# Patient Record
Sex: Male | Born: 1975 | Race: Black or African American | Hispanic: No | Marital: Single | Smoking: Current every day smoker
Health system: Southern US, Community
[De-identification: ages and names within clinical notes are randomized; demographics above are authoritative.]

## PROBLEM LIST (undated history)

## (undated) DIAGNOSIS — M1990 Unspecified osteoarthritis, unspecified site: Secondary | ICD-10-CM

## (undated) DIAGNOSIS — D759 Disease of blood and blood-forming organs, unspecified: Secondary | ICD-10-CM

## (undated) DIAGNOSIS — S82122D Displaced fracture of lateral condyle of left tibia, subsequent encounter for closed fracture with routine healing: Secondary | ICD-10-CM

## (undated) DIAGNOSIS — S8290XA Unspecified fracture of unspecified lower leg, initial encounter for closed fracture: Secondary | ICD-10-CM

## (undated) DIAGNOSIS — B20 Human immunodeficiency virus [HIV] disease: Secondary | ICD-10-CM

## (undated) DIAGNOSIS — Z21 Asymptomatic human immunodeficiency virus [HIV] infection status: Secondary | ICD-10-CM

## (undated) DIAGNOSIS — I10 Essential (primary) hypertension: Secondary | ICD-10-CM

## (undated) HISTORY — DX: Essential (primary) hypertension: I10

---

## 2000-11-13 ENCOUNTER — Emergency Department (HOSPITAL_COMMUNITY): Admission: EM | Admit: 2000-11-13 | Discharge: 2000-11-13 | Payer: Self-pay | Admitting: *Deleted

## 2000-11-21 ENCOUNTER — Encounter: Payer: Self-pay | Admitting: Emergency Medicine

## 2000-11-21 ENCOUNTER — Emergency Department (HOSPITAL_COMMUNITY): Admission: EM | Admit: 2000-11-21 | Discharge: 2000-11-21 | Payer: Self-pay | Admitting: Emergency Medicine

## 2009-08-06 HISTORY — PX: LEG SURGERY: SHX1003

## 2009-09-02 DIAGNOSIS — B20 Human immunodeficiency virus [HIV] disease: Secondary | ICD-10-CM | POA: Insufficient documentation

## 2009-09-02 DIAGNOSIS — M2550 Pain in unspecified joint: Secondary | ICD-10-CM

## 2009-10-03 ENCOUNTER — Encounter: Payer: Self-pay | Admitting: Infectious Disease

## 2009-10-24 ENCOUNTER — Ambulatory Visit: Payer: Self-pay | Admitting: Internal Medicine

## 2009-10-24 ENCOUNTER — Encounter: Payer: Self-pay | Admitting: Infectious Disease

## 2009-10-24 ENCOUNTER — Encounter: Payer: Self-pay | Admitting: Infectious Diseases

## 2009-10-24 LAB — CONVERTED CEMR LAB
ALT: 12 units/L (ref 0–53)
AST: 10 units/L (ref 0–37)
Albumin: 4 g/dL (ref 3.5–5.2)
Alkaline Phosphatase: 44 units/L (ref 39–117)
BUN: 12 mg/dL (ref 6–23)
Basophils Absolute: 0 10*3/uL (ref 0.0–0.1)
Basophils Relative: 0 % (ref 0–1)
Bilirubin Urine: NEGATIVE
CO2: 25 meq/L (ref 19–32)
Calcium: 9.1 mg/dL (ref 8.4–10.5)
Casts: NONE SEEN /lpf
Chlamydia, Swab/Urine, PCR: NEGATIVE
Chloride: 103 meq/L (ref 96–112)
Cholesterol: 137 mg/dL (ref 0–200)
Creatinine, Ser: 0.89 mg/dL (ref 0.40–1.50)
Crystals: NONE SEEN
Eosinophils Absolute: 0.1 10*3/uL (ref 0.0–0.7)
Eosinophils Relative: 2 % (ref 0–5)
GC Probe Amp, Urine: NEGATIVE
Glucose, Bld: 93 mg/dL (ref 70–99)
HCT: 41 % (ref 39.0–52.0)
HCV Ab: NEGATIVE
HDL: 41 mg/dL (ref >39)
HIV 1 RNA Quant: 10500 copies/mL — ABNORMAL HIGH (ref <48)
HIV-1 RNA Quant, Log: 4.02 — ABNORMAL HIGH (ref <1.68)
HIV-1 antibody: POSITIVE — AB
HIV-2 Ab: UNDETERMINED — AB
HIV: REACTIVE
Hemoglobin, Urine: NEGATIVE
Hemoglobin: 12.9 g/dL — ABNORMAL LOW (ref 13.0–17.0)
Hep A Total Ab: NEGATIVE
Hep B Core Total Ab: NEGATIVE
Hep B S Ab: NEGATIVE
Hepatitis B Surface Ag: NEGATIVE
Ketones, ur: NEGATIVE mg/dL
LDL Cholesterol: 83 mg/dL (ref 0–99)
Leukocytes, UA: NEGATIVE
Lymphocytes Relative: 24 % (ref 12–46)
Lymphs Abs: 1.1 10*3/uL (ref 0.7–4.0)
MCHC: 31.5 g/dL (ref 30.0–36.0)
MCV: 86.9 fL (ref >=78.0)
Monocytes Absolute: 0.5 10*3/uL (ref 0.1–1.0)
Monocytes Relative: 11 % (ref 3–12)
Neutro Abs: 2.8 10*3/uL (ref 1.7–7.7)
Neutrophils Relative %: 63 % (ref 43–77)
Nitrite: NEGATIVE
Platelets: 373 10*3/uL (ref 150–400)
Potassium: 4 meq/L (ref 3.5–5.3)
Protein, ur: NEGATIVE mg/dL
RBC: 4.72 M/uL (ref 4.22–5.81)
RDW: 12.9 % (ref 11.5–15.5)
Sodium: 139 meq/L (ref 135–145)
Specific Gravity, Urine: 1.025 (ref 1.005–1.0)
Squamous Epithelial / HPF: NONE SEEN /lpf
Total Bilirubin: 0.5 mg/dL (ref 0.3–1.2)
Total CHOL/HDL Ratio: 3.3
Total Protein: 7.6 g/dL (ref 6.0–8.3)
Triglycerides: 66 mg/dL (ref <150)
Urine Glucose: NEGATIVE mg/dL
Urobilinogen, UA: 0.2 (ref 0.0–1.0)
VLDL: 13 mg/dL (ref 0–40)
WBC: 4.4 10*3/uL (ref 4.0–10.5)
pH: 5.5 (ref 5.0–8.0)

## 2009-11-16 ENCOUNTER — Ambulatory Visit: Payer: Self-pay | Admitting: Internal Medicine

## 2009-11-16 ENCOUNTER — Encounter (INDEPENDENT_AMBULATORY_CARE_PROVIDER_SITE_OTHER): Payer: Self-pay | Admitting: *Deleted

## 2009-11-16 DIAGNOSIS — I10 Essential (primary) hypertension: Secondary | ICD-10-CM

## 2009-11-16 DIAGNOSIS — M25569 Pain in unspecified knee: Secondary | ICD-10-CM

## 2009-11-24 ENCOUNTER — Telehealth: Payer: Self-pay | Admitting: Internal Medicine

## 2009-12-23 ENCOUNTER — Telehealth: Payer: Self-pay | Admitting: Internal Medicine

## 2010-01-19 ENCOUNTER — Ambulatory Visit: Payer: Self-pay | Admitting: Internal Medicine

## 2010-01-19 DIAGNOSIS — L0293 Carbuncle, unspecified: Secondary | ICD-10-CM

## 2010-01-19 DIAGNOSIS — L0292 Furuncle, unspecified: Secondary | ICD-10-CM | POA: Insufficient documentation

## 2010-01-23 ENCOUNTER — Ambulatory Visit: Payer: Self-pay | Admitting: Internal Medicine

## 2010-01-23 DIAGNOSIS — R634 Abnormal weight loss: Secondary | ICD-10-CM

## 2010-01-23 LAB — CONVERTED CEMR LAB
ALT: 31 units/L (ref 0–53)
AST: 23 units/L (ref 0–37)
Albumin: 3.7 g/dL (ref 3.5–5.2)
Alkaline Phosphatase: 60 units/L (ref 39–117)
BUN: 19 mg/dL (ref 6–23)
Basophils Absolute: 0 10*3/uL (ref 0.0–0.1)
Basophils Relative: 0 % (ref 0–1)
CO2: 23 meq/L (ref 19–32)
Calcium: 9.2 mg/dL (ref 8.4–10.5)
Chloride: 103 meq/L (ref 96–112)
Creatinine, Ser: 0.73 mg/dL (ref 0.40–1.50)
Eosinophils Absolute: 0.2 10*3/uL (ref 0.0–0.7)
Eosinophils Relative: 4 % (ref 0–5)
Glucose, Bld: 89 mg/dL (ref 70–99)
HCT: 39.7 % (ref 39.0–52.0)
HIV 1 RNA Quant: <48 copies/mL (ref <48)
HIV-1 RNA Quant, Log: <1.68 (ref <1.68)
Hemoglobin: 12.9 g/dL — ABNORMAL LOW (ref 13.0–17.0)
Lymphocytes Relative: 32 % (ref 12–46)
Lymphs Abs: 1.8 10*3/uL (ref 0.7–4.0)
MCHC: 32.5 g/dL (ref 30.0–36.0)
MCV: 86.3 fL (ref 78.0–100.0)
Monocytes Absolute: 0.5 10*3/uL (ref 0.1–1.0)
Monocytes Relative: 9 % (ref 3–12)
Neutro Abs: 3.1 10*3/uL (ref 1.7–7.7)
Neutrophils Relative %: 56 % (ref 43–77)
Platelets: 375 10*3/uL (ref 150–400)
Potassium: 4.7 meq/L (ref 3.5–5.3)
RBC: 4.6 M/uL (ref 4.22–5.81)
RDW: 15 % (ref 11.5–15.5)
Sodium: 138 meq/L (ref 135–145)
Total Bilirubin: 0.3 mg/dL (ref 0.3–1.2)
Total Protein: 7.9 g/dL (ref 6.0–8.3)
WBC: 5.5 10*3/uL (ref 4.0–10.5)

## 2010-01-31 ENCOUNTER — Telehealth: Payer: Self-pay | Admitting: Internal Medicine

## 2010-02-22 ENCOUNTER — Ambulatory Visit: Payer: Self-pay | Admitting: Internal Medicine

## 2010-02-22 DIAGNOSIS — F17200 Nicotine dependence, unspecified, uncomplicated: Secondary | ICD-10-CM

## 2010-03-01 ENCOUNTER — Ambulatory Visit: Payer: Self-pay | Admitting: Infectious Diseases

## 2010-03-21 ENCOUNTER — Encounter: Payer: Self-pay | Admitting: Internal Medicine

## 2010-04-24 ENCOUNTER — Ambulatory Visit (HOSPITAL_BASED_OUTPATIENT_CLINIC_OR_DEPARTMENT_OTHER): Admission: RE | Admit: 2010-04-24 | Discharge: 2010-04-24 | Payer: Self-pay | Admitting: General Surgery

## 2010-06-07 ENCOUNTER — Ambulatory Visit: Payer: Self-pay | Admitting: Infectious Diseases

## 2010-06-07 LAB — CONVERTED CEMR LAB
ALT: 17 units/L (ref 0–53)
AST: 18 units/L (ref 0–37)
Albumin: 4 g/dL (ref 3.5–5.2)
Alkaline Phosphatase: 67 units/L (ref 39–117)
BUN: 13 mg/dL (ref 6–23)
Basophils Absolute: 0 10*3/uL (ref 0.0–0.1)
Basophils Relative: 0 % (ref 0–1)
CO2: 25 meq/L (ref 19–32)
Calcium: 8.8 mg/dL (ref 8.4–10.5)
Chloride: 106 meq/L (ref 96–112)
Creatinine, Ser: 0.76 mg/dL (ref 0.40–1.50)
Eosinophils Absolute: 0.2 10*3/uL (ref 0.0–0.7)
Eosinophils Relative: 4 % (ref 0–5)
Glucose, Bld: 91 mg/dL (ref 70–99)
HCT: 41.1 % (ref 39.0–52.0)
HIV 1 RNA Quant: <20 copies/mL (ref <20)
HIV-1 RNA Quant, Log: <1.3 (ref <1.30)
Hemoglobin: 13.6 g/dL (ref 13.0–17.0)
Lymphocytes Relative: 29 % (ref 12–46)
Lymphs Abs: 1.2 10*3/uL (ref 0.7–4.0)
MCHC: 33.1 g/dL (ref 30.0–36.0)
MCV: 90.7 fL (ref 78.0–100.0)
Monocytes Absolute: 0.5 10*3/uL (ref 0.1–1.0)
Monocytes Relative: 13 % — ABNORMAL HIGH (ref 3–12)
Neutro Abs: 2.2 10*3/uL (ref 1.7–7.7)
Neutrophils Relative %: 54 % (ref 43–77)
Platelets: 277 10*3/uL (ref 150–400)
Potassium: 4.1 meq/L (ref 3.5–5.3)
RBC: 4.53 M/uL (ref 4.22–5.81)
RDW: 13.9 % (ref 11.5–15.5)
Sodium: 140 meq/L (ref 135–145)
Total Bilirubin: 0.4 mg/dL (ref 0.3–1.2)
Total Protein: 7.2 g/dL (ref 6.0–8.3)
WBC: 4.1 10*3/uL (ref 4.0–10.5)

## 2010-09-05 NOTE — Progress Notes (Signed)
Summary: Lab results  Phone Note Call from Patient   Caller: [phone] ext [phone]  Summary of Call: Pt is calling for test results that would check drug resistance. Tomasita Morrow RN  November 24, 2009 2:34 PM   Follow-up for Phone Call        please apologize to him for not calling He is not resistant to anything so we can start Atripla we discussed potential side efffects at alst visit He should call if he has any problems. If no problems return in 6 weeks for labs and f/u with me 2 weeks later med list updated Follow-up by: Yisroel Ramming MD,  November 24, 2009 3:11 PM    New/Updated Medications: ATRIPLA 600-200-300 MG TABS (EFAVIRENZ-EMTRICITAB-TENOFOVIR) Take 1 tablet by mouth at bedtime Prescriptions: ATRIPLA 600-200-300 MG TABS (EFAVIRENZ-EMTRICITAB-TENOFOVIR) Take 1 tablet by mouth at bedtime  #30 x 3   Entered by:   Tomasita Morrow RN   Authorized by:   Yisroel Ramming MD   Signed by:   Tomasita Morrow RN on 11/24/2009   Method used:   [facility]        [facility] [address] [phone]* (retail)       [address]       [place], [place]  [address]       Ph: [phone] or [phone]       Fax: [phone]   RxID:   [id number]
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
## 2010-09-05 NOTE — Progress Notes (Signed)
Summary: Request for Ibuprofen  Phone Note Call from Patient Call back at Home Phone [phone]   Caller: Patient Reason for Call: Refill Medication Summary of Call: Patient called requesting a new prescription be sent to a different pharmacy (preferably [facility]) for Ibuprofen.  Patient would like a call back at [phone] or work [phone] Xt [phone] once it has been sent. Initial call taken by: Paulo Fruit  BS,CPht II,MPH,  Dec 23, 2009 11:50 AM  Follow-up for Phone Call        ok # 90 with 5 refills Follow-up by: Yisroel Ramming MD,  Dec 23, 2009 3:25 PM    Prescriptions: IBUPROFEN 200 MG TABS (IBUPROFEN) take two every 8 hours for body aches  #90 x 5   Entered by:   Paulo Fruit  BS,CPht II,MPH   Authorized by:   Yisroel Ramming MD   Signed by:   Paulo Fruit  BS,CPht II,MPH on 12/26/2009   Method used:   Electronically to        [facility] [address] [phone]* (retail)       [address]       [place], [place]  [address]       Ph: [phone] or [phone]       Fax: [phone]   RxID:   [id number]  Paulo Fruit  BS,CPht II,MPH  Dec 26, 2009 3:11 PM

## 2010-09-05 NOTE — Miscellaneous (Signed)
Summary: HIPAA Restrictions  HIPAA Restrictions   Imported By: Florinda Marker 01/23/2010 15:21:03  _____________________________________________________________________  External Attachment:    Type:   Image     Comment:   External Document

## 2010-09-05 NOTE — Assessment & Plan Note (Signed)
Summary: new 042 pt/intake 3/21/kam   CC:  new patient to establish, B/P elevated, and knee and wrist pain .  History of Present Illness: This is the first ID clinic visit for Mr. Gregory Whitney. He was diagnosed HIV positive 1/11.  He had never been tested prior to this. He c/o several months of progressive diffuse joint pain worse in right knee. In fact he is unable to fully flex his right knee.  No known injury or recent change in activity. He has also been told in the past that he had HTN but he does not have a PCP that he has been seeing. HTN runs in his family. He denies night sweats, recent weight loss or diarrhea. His risk factor is MSM. He currently does not have a partner.  Preventive Screening-Counseling & Management  Alcohol-Tobacco     Alcohol drinks/day: 1/2 to 1 pint per day     Alcohol type: spirits     Smoking Status: current     Packs/Day: 1     Year Started: 15 years ago  Caffeine-Diet-Exercise     Caffeine use/day: 10 sodas per day     Does Patient Exercise: no  Safety-Violence-Falls     Seat Belt Use: yes      Sexual History:  n/a.        Drug Use:  Yes.     Current Allergies (reviewed today): No known allergies  Social History: Sexual History:  n/a  Review of Systems  The patient denies anorexia, fever, weight loss, chest pain, and headaches.    Vital Signs:  Patient profile:   35 year old male Height:      74 inches (187.96 cm) Weight:      198.8 pounds (90.36 kg) BMI:     25.62 Temp:     98.6 degrees F (37.00 degrees C) oral Pulse rate:   98 / minute BP sitting:   156 / 110  (right arm)  Vitals Entered By: Wendall Mola CMA Duncan Dull) (November 16, 2009 11:10 AM) CC: new patient to establish, B/P elevated, knee and wrist pain  Is Patient Diabetic? No Pain Assessment Patient in pain? no      Nutritional Status BMI of 25 - 29 = overweight Nutritional Status Detail appetite "good"  Does patient need assistance? Functional Status Self  care Ambulation Normal Comments pt states B/P has been elevated for awhile   Physical Exam  General:  alert, well-developed, well-nourished, and well-hydrated.   Head:  normocephalic and atraumatic.   Mouth:  pharynx pink and moist.  no thrush  Neck:  no cervical lymphadenopathy.   Lungs:  normal breath sounds.   Heart:  normal rate, regular rhythm, and no murmur.   Msk:  right knee with an effusion ability to fully extend but not flex no warmth or readness   Impression & Recommendations:  Problem # 1:  HIV INFECTION (ICD-042) Discussed pathophysiology of HIV and the meaning of CD4ct and VL.  Pt.s current Cd4ct is 90  and VL is 10,500 and genotype is pending .  At this Point antiretroviral treatment is indicated .  Discussed treatment options. We will await to start treatment pending the genotype results. If he has a niave virus he would like to start Atripla.  I will call him once his genotype returns.  PCP prophylaxis was started with Bactrim.Discussed safe sex and transmisiion routes with the patient.  Hep B vaccine #1 was given.  Orders: New Patient Level III [phone])  Diagnostics  Reviewed:  HIV: REACTIVE (10/24/2009)   HIV-Western blot: Positive (10/24/2009)   CD4: 90 (10/25/2009)   WBC: 4.4 (10/24/2009)   Hgb: 12.9 (10/24/2009)   HCT: 41.0 (10/24/2009)   Platelets: 373 (10/24/2009) HIV genotype: * (10/24/2009)   HIV-1 RNA: 10500 (10/24/2009)   HBSAg: NEG (10/24/2009)  Problem # 2:  ESSENTIAL HYPERTENSION, BENIGN (ICD-401.1) Will start Atenolol. His updated medication list for this problem includes:    Atenolol 50 Mg Tabs (Atenolol) .Marland Kitchen... Take 1 tablet by mouth once a day  Problem # 3:  KNEE PAIN, RIGHT (ICD-719.46) refer to orthopedics. His updated medication list for this problem includes:    Ibuprofen 200 Mg Tabs (Ibuprofen) .Marland Kitchen... Take two every 8 hours for body aches  Orders: Orthopedic Referral (Ortho)  Medications Added to Medication List This Visit: 1)   Atenolol 50 Mg Tabs (Atenolol) .... Take 1 tablet by mouth once a day 2)  Bactrim Ds 800-160 Mg Tabs (Sulfamethoxazole-trimethoprim) .... Take 1 tablet by mouth once a day  Other Orders: Hepatitis B Vaccine >39yrs (29528) Admin 1st Vaccine (41324) Prescriptions: ATENOLOL 50 MG TABS (ATENOLOL) Take 1 tablet by mouth once a day  #30 x 5   Entered and Authorized by:   Yisroel Ramming MD   Signed by:   Yisroel Ramming MD on 11/16/2009   Method used:   Print then Give to Patient   RxID:   [id number] BACTRIM DS 800-160 MG TABS (SULFAMETHOXAZOLE-TRIMETHOPRIM) Take 1 tablet by mouth once a day  #30 x 5   Entered and Authorized by:   Yisroel Ramming MD   Signed by:   Yisroel Ramming MD on 11/16/2009   Method used:   Print then Give to Patient   RxID:   [id number]    Immunizations Administered:  Hepatitis B Vaccine # 1:    Vaccine Type: HepB Adult    Site: left deltoid    Mfr: Merck    Dose: 0.5 ml    Route: IM    Given by: Wendall Mola CMA ( AAMA)    Exp. Date: 05/27/2012    Lot #: [id number]    VIS given: 02/20/06 version given November 16, 2009.

## 2010-09-05 NOTE — Letter (Signed)
Summary: [facility]   Imported By: Florinda Marker 04/20/2010 14:28:21  _____________________________________________________________________  External Attachment:    Type:   Image     Comment:   External Document

## 2010-09-05 NOTE — Assessment & Plan Note (Signed)
Summary: new 042 intake/kam      Infectious Disease New Patient Intake Referring MD/Agency: Dr Ellamae Sia Address: [address] [place], [place]  [address]  Return Appointment Date: 11/16/2009    Do you have a Primary physician: No Are family members aware of patient's diagnosis?  If so, are they supportive? None  Medical History Medication Allergies: No   Medications: Yes   Current Meds:  IBUPROFEN 200 MG TABS (IBUPROFEN) take two every 8 hours for body aches   Family History Hypertension: Yes  Family Side: Paternal  Comments: 33  Tobacco use: current Year started: 15 years ago Amt: 1 packs per day.  Behavioral Health Assessment Have you ever been diagnosed with depression or mental illness? No  Do you drink alcohol? No Do you use recreational drugs? Yes Drugs: marijuana- 2 joints daily  Do you feel you have a problem with drugs and/or alcohol? No   Have you ever been in a treatment facility for any addiction? No If you are currently using drugs and/or alcohol, would you like help for this addiction? No  HIV Intake Information When did you first test positive for HIV? 09/02/2009 Type of test Conducted: WB   Where was this test performed?  Name of Agency: [facility]  City/State: [place], [place]   [place]: [place] Was this your first time ever being tested or HIV? 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   Have you ever been hospitalized for any HIV-related condition? No  Have you ever been under the care of a physician for being HIV positive? No  Newly Diagnosed Patients Has a Disease Intervention Specialist from the Health Department contacted the patient? Yes.   The patient has been informed that the [facility] Department will contact ALL newly reported cases. Health Department Contact:  [phone]   (SSN is needed for confirmation)  Reported Today:  10/13/2009 Health Department Contact:  [phone]            (SSN is needed for confirmation)  Person Reporting: State [facility]  HIV Medications Information The patient is currently NOT taking any HIV medications.  Infection History  Patient has been diagnosed with the following opportunistic infections: Are there any other symptoms you need to discuss? No Have you received literature/education prior to this visit about HIV/AIDS? No Do you understand the meaning of a Viral Load? No Do you understand the meaning of a CD4 count? No Lab Values Education/Handout Given Yes Medication Education/Handout Given Yes  Sexual History Are you in a current relationship? No Are you currently sexually active? No Safe Sex Counseling/Pamphlet Given Sexual History Comments: One sexual partner within the last year.   Evaluation and Follow-Up INTAKE CHECK LIST: HIV Education, Safe Sex Counseling, Case Management Referral, HIV Material Given, [employer] Consent, Sliding Fee Scale  Prevention For Positives: 10/27/2009   Safe sex practices discussed with patient. Condoms offered. Juanell Fairly Consent: Yes Are you in need of condoms at this time? No Our patient has been informed that condoms are always available in this clinic.   Brochure Provided for Above Organizations? No Does patient have problems that warrant Social Worker referral? No             Prevention For Positives: 10/27/2009   Safe sex practices discussed with patient. Condoms offered.        10/27/2009   Patient was screened for substance abuse and depression. Referal  was made as indicated.                      Immunizations Administered:  Influenza Vaccine # 1:    Vaccine Type: Fluvax Non-MCR    Site: right deltoid    Mfr: NOVARTIS    Dose: 0.5 ml    Route: IM    Given by: Tomasita Morrow RN    Exp. Date: 12/03/2009    Lot #: [id number]    VIS given: 02/27/07 version given October 24, 2009.  PPD Skin Test:    Vaccine Type: PPD     Site: left forearm    Mfr: Sanofi Pasteur    Dose: 0.05 ml    Route: ID    Given by: Tomasita Morrow RN    Exp. Date: 01/01/2012    Lot #: [id number]  Pneumonia Vaccine:    Vaccine Type: Pneumovax    Site: right deltoid    Mfr: Merck    Dose: 0.5 ml    Route: IM    Given by: Tomasita Morrow RN    Exp. Date: 03/20/2011    Lot #: [id number]    VIS given: 03/03/96 version given October 24, 2009.  Flu Vaccine Consent Questions:    Do you have a history of severe allergic reactions to this vaccine? no    Any prior history of allergic reactions to egg and/or gelatin? no    Do you have a sensitivity to the preservative Thimersol? no    Do you have a past history of Guillan-Barre Syndrome? no    Do you currently have an acute febrile illness? no    Have you ever had a severe reaction to latex? no    Vaccine information given and explained to patient? yes

## 2010-09-05 NOTE — Assessment & Plan Note (Signed)
Summary: NEW O42[MKJ]   CC:  follow-up visit.  History of Present Illness: Pt here to get labs.  The boil has improved and continues to drain.  His appetite is improving but he has lost a lot of weight. No night sweats or fevers.  No cough. No swollen nodes.  Preventive Screening-Counseling & Management  Alcohol-Tobacco     Alcohol drinks/day: 0     Smoking Status: current     Packs/Day: 1     Year Started: 15 years ago  Caffeine-Diet-Exercise     Caffeine use/day: 3 sodas per week     Does Patient Exercise: no  Safety-Violence-Falls     Seat Belt Use: yes      Sexual History:  n/a.        Drug Use:  Yes.    Comments: pt. declined condoms   Updated Prior Medication List: IBUPROFEN 200 MG TABS (IBUPROFEN) take two every 8 hours for body aches ATENOLOL 50 MG TABS (ATENOLOL) Take 1 tablet by mouth once a day BACTRIM DS 800-160 MG TABS (SULFAMETHOXAZOLE-TRIMETHOPRIM) Take 1 tablet by mouth once a day ATRIPLA 600-200-300 MG TABS (EFAVIRENZ-EMTRICITAB-TENOFOVIR) Take 1 tablet by mouth at bedtime DOXYCYCLINE HYCLATE 100 MG CAPS (DOXYCYCLINE HYCLATE) Take 1 capsule by mouth two times a day ULTRAM 50 MG TABS (TRAMADOL HCL) Take 1 tablet by mouth every 8 hours as needed  Current Allergies (reviewed today): No known allergies  Review of Systems  The patient denies anorexia, fever, weight loss, abdominal pain, and severe indigestion/heartburn.    Vital Signs:  Patient profile:   35 year old male Height:      74 inches (187.96 cm) Weight:      173.0 pounds (78.64 kg) BMI:     22.29 Temp:     98.4 degrees F (36.89 degrees C) oral Pulse rate:   86 / minute BP sitting:   128 / 80  Vitals Entered By: Wendall Mola CMA Duncan Dull) (January 23, 2010 2:08 PM) CC: follow-up visit Is Patient Diabetic? No Pain Assessment Patient in pain? no      Nutritional Status BMI of 19 -24 = normal Nutritional Status Detail appetite "better"  Have you ever been in a relationship where  you felt threatened, hurt or afraid?No   Does patient need assistance? Functional Status Self care Ambulation Normal Comments no missed doses of meds per patient   Physical Exam  General:  alert, well-hydrated, and underweight appearing.   Head:  normocephalic and atraumatic.   Mouth:  pharynx pink and moist.   Lungs:  normal breath sounds.     Impression & Recommendations:  Problem # 1:  HIV INFECTION (ICD-042) Will obtain labs today and have pt f/u in 2 weeks. Orders: Est. Patient Level III (04540)  Diagnostics Reviewed:  HIV: REACTIVE (10/24/2009)   HIV-Western blot: Positive (10/24/2009)   CD4: 90 (10/25/2009)   WBC: 4.4 (10/24/2009)   Hgb: 12.9 (10/24/2009)   HCT: 41.0 (10/24/2009)   Platelets: 373 (10/24/2009) HIV genotype: * (10/24/2009)   HIV-1 RNA: 10500 (10/24/2009)   HBSAg: NEG (10/24/2009)  Problem # 2:  LOSS OF WEIGHT (ICD-783.21) pt on protein shakes if continues will need w/u  Patient Instructions: 1)  Please schedule a follow-up appointment in 2 weeks.
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
## 2010-09-05 NOTE — Consult Note (Signed)
Summary: New Pt. Referral: Dr. Merla Riches  New Pt. Referral: Dr. Merla Riches   Imported By: Florinda Marker 11/17/2009 16:21:15  _____________________________________________________________________  External Attachment:    Type:   Image     Comment:   External Document

## 2010-09-05 NOTE — Miscellaneous (Signed)
Summary: HIPAA Restrictions  HIPAA Restrictions   Imported By: Florinda Marker 10/24/2009 14:24:24  _____________________________________________________________________  External Attachment:    Type:   Image     Comment:   External Document

## 2010-09-05 NOTE — Miscellaneous (Signed)
Summary: clinical update/ryan white  Clinical Lists Changes  Observations: Added new observation of PAYOR: Private (11/16/2009 12:09) Added new observation of AIDSDAP: No (11/16/2009 12:09) Added new observation of PCTFPL: 171.13  (11/16/2009 12:09) Added new observation of INCOMESOURCE: WAGES  (11/16/2009 12:09) Added new observation of HOUSEINCOME: 44010  (11/16/2009 12:09) Added new observation of #CHILD<18 IN: No  (11/16/2009 12:09) Added new observation of FAMILYSIZE: 1  (11/16/2009 12:09) Added new observation of HOUSING: Stable/permanent  (11/16/2009 12:09) Added new observation of FINASSESSDT: 11/16/2009  (11/16/2009 12:09) Added new observation of YEARLYEXPEN: 2100  (11/16/2009 12:09) Added new observation of RW VITAL STA: Active  (11/16/2009 12:09)

## 2010-09-05 NOTE — Progress Notes (Signed)
Summary: Patient requesting prescription  Phone Note Outgoing Call   Caller: Patient Reason for Call: Refill Medication Summary of Call: Patient left a message stating that he has switched pharmacies.  He was going to [facility] but [facility]  He [facility] someone to [facility]  I will call patient at the number he left [phone] or his work number that he also left [phone] to [facility] out [facility]. Initial call taken by: Paulo Fruit  BS,CPht II,MPH,  January 31, 2010 9:57 AM Call placed by: Paulo Fruit  BS,CPht II,MPH,  January 31, 2010 9:57 AM Call placed to: Patient Reason for Call: Get patient information Summary of Call: Left message for patient to call Byrd Hesselbach at [phone].  This message was left on his cell phone. Initial call taken by: Paulo Fruit  BS,CPht II,MPH,  January 31, 2010 9:59 AM  Follow-up for Phone Call        Patient called back and would like for the Mel[facility].  Patient states that he is having body aches and this seems to work for him.  Meloxicam 15mg  once daily Follow-up by: Paulo Fruit  BS,CPht II,MPH,  January 31, 2010 4:21 PM  Additional Follow-up for Phone Call Additional follow up Details #1::        ok #30 with 5 refils Additional Follow-up by: Yisroel Ramming MD,  February 01, 2010 9:35 AM    New/Updated Medications: MELOXICAM 15 MG TABS (MELOXICAM) Take 1 tablet by mouth once a day Prescriptions: MELOXICAM 15 MG TABS (MELOXICAM) Take 1 tablet by mouth once a day  #30 x 5   Entered by:   Paulo Fruit  BS,CPht II,MPH   Authorized by:   Yisroel Ramming MD   Signed by:   Yisroel Ramming MD on 02/02/2010   Method used:   [facility]        [facility] [address] [phone]* (retail)       [address]       [place], [place]  [address]       Ph: [phone] or [phone]       Fax: [phone]   RxID:   [id number]

## 2010-09-05 NOTE — Miscellaneous (Signed)
Summary: Problems, Allergies and Labs  Clinical Lists Changes  Problems: Added new problem of HIV INFECTION (ICD-042) Added new problem of ARTHRALGIA (ICD-719.40) Medications: Added new medication of MOBIC 15 MG TABS (MELOXICAM) Take 1 tablet by mouth once a day per PCP Orders: Added new Test order of T-CBC w/Diff [phone]) - Signed Added new Test order of T-CD4SP [facility]) (CD4SP) - Signed Added new Test order of T-Chlamydia  Probe, urine [phone]) - Signed Added new Test order of T-GC Probe, urine [id number]) - Signed Added new Test order of T-Comprehensive Metabolic Panel [phone]) - Signed Added new Test order of T-RPR (Syphilis) [phone]) - Signed Added new Test order of T-Hepatitis B Surface Antigen [phone]) - Signed Added new Test order of T-Hepatitis B Surface Antibody [phone]) - Signed Added new Test order of T-Hepatitis B Core Antibody ([id number]) - Signed Added new Test order of T-Hepatitis C Antibody ([id number]) - Signed Added new Test order of T-Hepatitis A Antibody ([id number]) - Signed Added new Test order of T-HIV Antibody  (Reflex) ([id number]) - Signed Added new Test order of T-HIV Viral Load (85462-70350) - Signed Added new Test order of T-HIV Ab Confirmatory Test/Western Blot ([id number]) - Signed Added new Test order of T-Urinalysis ([id number]) - Signed Observations: Added new observation of NKA: T (10/03/2009 12:21)     Process Orders Check Orders Results:     [facility]: ABN not required for this insurance Order queued for requisitioning for [facility]: October 03, 2009 12:33 PM  Tests Sent for requisitioning (October 03, 2009 12:33 PM):     10/03/2009: [facility] -- T-CBC w/Diff [[id number]] (signed)     10/03/2009: [facility] -- [employer], urine [phone] (signed)     10/03/2009: [facility] -- T-GC Probe, urine [phone] (signed)    10/03/2009: [facility] -- T-Comprehensive Metabolic Panel [[id number]] (signed)     10/03/2009: [facility] -- T-RPR (Syphilis) [phone] (signed)     10/03/2009: [facility] -- T-Hepatitis B Surface Antigen [[id number]] (signed)     10/03/2009: [facility] -- T-Hepatitis B Surface Antibody [[id number]] (signed)     10/03/2009: [facility] -- T-Hepatitis B Core Antibody [[id number]] (signed)     10/03/2009: [facility] -- T-Hepatitis C Antibody [[id number]] (signed)     10/03/2009: [facility] -- T-Hepatitis A Antibody [[id number]] (signed)     10/03/2009: [facility] -- T-HIV Antibody  (Reflex) [[id number]] (signed)     10/03/2009: [facility] -- T-HIV Viral Load [phone] (signed)     10/03/2009: [facility] -- T-HIV Ab Confirmatory Test/Western Blot [phone] (signed)     10/03/2009: [facility] -- T-Urinalysis [[id number]] (signed)
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
## 2010-10-17 LAB — T-HELPER CELL (CD4) - (RCID CLINIC ONLY)
CD4 % Helper T Cell: 16 % — ABNORMAL LOW (ref 33–55)
CD4 T Cell Abs: 190 uL — ABNORMAL LOW (ref 400–2700)

## 2010-10-19 LAB — HEPATIC FUNCTION PANEL
AST: 24 U/L (ref 0–37)
Albumin: 3.6 g/dL (ref 3.5–5.2)
Alkaline Phosphatase: 61 U/L (ref 39–117)
Bilirubin, Direct: 0.3 mg/dL (ref 0.0–0.3)
Total Bilirubin: 0.4 mg/dL (ref 0.3–1.2)

## 2010-10-19 LAB — BASIC METABOLIC PANEL
BUN: 10 mg/dL (ref 6–23)
CO2: 24 mEq/L (ref 19–32)
Calcium: 9 mg/dL (ref 8.4–10.5)
Chloride: 102 mEq/L (ref 96–112)
Creatinine, Ser: 0.7 mg/dL (ref 0.4–1.5)
GFR calc Af Amer: >60 mL/min (ref >60)
GFR calc non Af Amer: >60 mL/min (ref >60)
Glucose, Bld: 96 mg/dL (ref 70–99)
Potassium: 3.9 mEq/L (ref 3.5–5.1)
Sodium: 136 mEq/L (ref 135–145)

## 2010-10-19 LAB — DIFFERENTIAL
Basophils Relative: 0 % (ref 0–1)
Eosinophils Absolute: 0.1 10*3/uL (ref 0.0–0.7)
Lymphs Abs: 1.9 10*3/uL (ref 0.7–4.0)
Monocytes Absolute: 0.5 10*3/uL (ref 0.1–1.0)
Monocytes Relative: 10 % (ref 3–12)
Neutro Abs: 2.5 10*3/uL (ref 1.7–7.7)
Neutrophils Relative %: 51 % (ref 43–77)

## 2010-10-19 LAB — ANAEROBIC CULTURE

## 2010-10-19 LAB — CBC
HCT: 39.1 % (ref 39.0–52.0)
Hemoglobin: 13.4 g/dL (ref 13.0–17.0)
MCH: 30.7 pg (ref 26.0–34.0)
MCHC: 34.3 g/dL (ref 30.0–36.0)
RBC: 4.37 MIL/uL (ref 4.22–5.81)

## 2010-10-19 LAB — AFB CULTURE WITH SMEAR (NOT AT ARMC)

## 2010-10-22 LAB — T-HELPER CELL (CD4) - (RCID CLINIC ONLY)
CD4 % Helper T Cell: 13 % — ABNORMAL LOW (ref 33–55)
CD4 T Cell Abs: 230 uL — ABNORMAL LOW (ref 400–2700)

## 2010-10-23 ENCOUNTER — Other Ambulatory Visit: Payer: Self-pay

## 2010-10-30 LAB — T-HELPER CELL (CD4) - (RCID CLINIC ONLY)
CD4 % Helper T Cell: 9 % — ABNORMAL LOW (ref 33–55)
CD4 T Cell Abs: 90 uL — ABNORMAL LOW (ref 400–2700)

## 2010-10-31 ENCOUNTER — Other Ambulatory Visit: Payer: 59

## 2010-10-31 ENCOUNTER — Other Ambulatory Visit: Payer: Self-pay | Admitting: Licensed Clinical Social Worker

## 2010-10-31 DIAGNOSIS — B20 Human immunodeficiency virus [HIV] disease: Secondary | ICD-10-CM

## 2010-10-31 LAB — CBC WITH DIFFERENTIAL/PLATELET
Basophils Relative: 0 % (ref 0–1)
Eosinophils Absolute: 0.1 10*3/uL (ref 0.0–0.7)
Eosinophils Relative: 3 % (ref 0–5)
Lymphs Abs: 1.2 10*3/uL (ref 0.7–4.0)
MCH: 30 pg (ref 26.0–34.0)
MCHC: 33 g/dL (ref 30.0–36.0)
MCV: 90.7 fL (ref 78.0–100.0)
Platelets: 306 10*3/uL (ref 150–400)
RBC: 4.54 MIL/uL (ref 4.22–5.81)
RDW: 13.1 % (ref 11.5–15.5)

## 2010-10-31 NOTE — Progress Notes (Signed)
Opened in error

## 2010-11-01 LAB — COMPREHENSIVE METABOLIC PANEL
ALT: 16 U/L (ref 0–53)
AST: 14 U/L (ref 0–37)
Albumin: 4 g/dL (ref 3.5–5.2)
Alkaline Phosphatase: 69 U/L (ref 39–117)
Potassium: 4.3 mEq/L (ref 3.5–5.3)
Sodium: 140 mEq/L (ref 135–145)
Total Bilirubin: 0.3 mg/dL (ref 0.3–1.2)
Total Protein: 7.1 g/dL (ref 6.0–8.3)

## 2010-11-01 LAB — T-HELPER CELL (CD4) - (RCID CLINIC ONLY): CD4 % Helper T Cell: 18 % — ABNORMAL LOW (ref 33–55)

## 2010-11-06 ENCOUNTER — Ambulatory Visit: Payer: Self-pay | Admitting: Infectious Diseases

## 2010-11-29 ENCOUNTER — Encounter: Payer: Self-pay | Admitting: Infectious Diseases

## 2010-11-29 ENCOUNTER — Ambulatory Visit (INDEPENDENT_AMBULATORY_CARE_PROVIDER_SITE_OTHER): Payer: 59 | Admitting: Infectious Diseases

## 2010-11-29 VITALS — BP 173/107 | HR 93 | Temp 98.3°F | Ht 74.0 in | Wt 182.1 lb

## 2010-11-29 DIAGNOSIS — B20 Human immunodeficiency virus [HIV] disease: Secondary | ICD-10-CM

## 2010-11-29 DIAGNOSIS — M25569 Pain in unspecified knee: Secondary | ICD-10-CM

## 2010-11-29 DIAGNOSIS — I10 Essential (primary) hypertension: Secondary | ICD-10-CM

## 2010-11-29 DIAGNOSIS — F17200 Nicotine dependence, unspecified, uncomplicated: Secondary | ICD-10-CM

## 2010-11-29 DIAGNOSIS — Z79899 Other long term (current) drug therapy: Secondary | ICD-10-CM

## 2010-11-29 DIAGNOSIS — Z113 Encounter for screening for infections with a predominantly sexual mode of transmission: Secondary | ICD-10-CM

## 2010-11-29 DIAGNOSIS — Z23 Encounter for immunization: Secondary | ICD-10-CM

## 2010-11-29 MED ORDER — ATENOLOL 50 MG PO TABS: 50.0000 mg | ORAL_TABLET | Freq: Every day | ORAL | Status: DC

## 2010-11-29 NOTE — Assessment & Plan Note (Signed)
Will cont to watch, seems improved.

## 2010-11-29 NOTE — Assessment & Plan Note (Signed)
He will restart his atenolol.

## 2010-11-29 NOTE — Progress Notes (Signed)
Addended by: Wendall Mola on: 11/29/2010 09:57 AM   Modules accepted: Orders

## 2010-11-29 NOTE — Assessment & Plan Note (Addendum)
He is doing well. Has some immune improvement. Will cont his meds. Gets 2nd hep A today. Will see him back in 5 months.

## 2010-11-29 NOTE — Assessment & Plan Note (Signed)
Encouraged to quit. 

## 2010-11-29 NOTE — Progress Notes (Signed)
  Subjective:    Patient ID: Gregory Whitney, male    DOB: 21-Feb-1976, 35 y.o.   MRN: [id number]  HPI 35 yo M with newly Dx AIDS 08-2009. CD4 was 90 on 10-2009. On 4-13 ws started on atripla. CD4 230 and VL <48 (01-23-10).  Has been seen by ortho for his R knee swelling. was started on meloxicam which he rarely takes.   no problems with medicine. no missed doses. CD4 210 and VL 26 (10-31-10). He quit taking the atenolol.    Review of Systems  Constitutional: Negative for appetite change and unexpected weight change.  Respiratory: Negative for chest tightness.   Gastrointestinal: Negative for diarrhea and constipation.  Genitourinary: Negative for difficulty urinating.  Neurological: Negative for headaches.       Objective:   Physical Exam  Constitutional: He appears well-developed and well-nourished.  Eyes: EOM are normal. Pupils are equal, round, and reactive to light.  Neck: Neck supple.  Cardiovascular: Normal rate, regular rhythm and normal heart sounds.   Pulmonary/Chest: Effort normal and breath sounds normal.  Abdominal: Soft. Bowel sounds are normal. He exhibits no distension.          Assessment & Plan:

## 2010-12-01 ENCOUNTER — Other Ambulatory Visit: Payer: Self-pay | Admitting: *Deleted

## 2010-12-01 DIAGNOSIS — B20 Human immunodeficiency virus [HIV] disease: Secondary | ICD-10-CM

## 2010-12-01 MED ORDER — EFAVIRENZ-EMTRICITAB-TENOFOVIR 600-200-300 MG PO TABS: 1.0000 | ORAL_TABLET | Freq: Every day | ORAL | Status: DC

## 2011-01-02 ENCOUNTER — Other Ambulatory Visit: Payer: Self-pay

## 2011-01-02 DIAGNOSIS — B20 Human immunodeficiency virus [HIV] disease: Secondary | ICD-10-CM

## 2011-01-02 MED ORDER — EFAVIRENZ-EMTRICITAB-TENOFOVIR 600-200-300 MG PO TABS: 1.0000 | ORAL_TABLET | Freq: Every day | ORAL | Status: DC

## 2011-03-02 ENCOUNTER — Telehealth: Payer: Self-pay | Admitting: Licensed Clinical Social Worker

## 2011-03-02 NOTE — Telephone Encounter (Signed)
Patient called stating that he thinks he has the same skin infection on the back of his leg as last year. He wants another rx for doxycycline. I advised the patient that he would need to make an appointment. He wants me to ask Dr. Ninetta Lights anyway, I advised the patient it would be on Monday because Dr. Ninetta Lights was out today Starleen Arms, CMA

## 2011-04-18 ENCOUNTER — Other Ambulatory Visit: Payer: 59

## 2011-05-16 ENCOUNTER — Other Ambulatory Visit: Payer: 59

## 2011-05-30 ENCOUNTER — Ambulatory Visit: Payer: 59 | Admitting: Infectious Diseases

## 2011-06-06 ENCOUNTER — Other Ambulatory Visit: Payer: 59

## 2011-06-20 ENCOUNTER — Ambulatory Visit: Payer: 59 | Admitting: Infectious Diseases

## 2011-06-20 ENCOUNTER — Telehealth: Payer: Self-pay | Admitting: *Deleted

## 2011-06-20 NOTE — Telephone Encounter (Signed)
I left message asking him to call & reschedule his appt

## 2011-07-02 ENCOUNTER — Other Ambulatory Visit: Payer: Self-pay | Admitting: *Deleted

## 2011-07-02 DIAGNOSIS — I10 Essential (primary) hypertension: Secondary | ICD-10-CM

## 2011-07-02 MED ORDER — ATENOLOL 50 MG PO TABS: 50.0000 mg | ORAL_TABLET | Freq: Every day | ORAL | Status: DC

## 2011-07-05 ENCOUNTER — Encounter: Payer: Self-pay | Admitting: *Deleted

## 2011-09-26 ENCOUNTER — Other Ambulatory Visit: Payer: 59

## 2011-09-26 DIAGNOSIS — I10 Essential (primary) hypertension: Secondary | ICD-10-CM

## 2011-09-26 DIAGNOSIS — Z113 Encounter for screening for infections with a predominantly sexual mode of transmission: Secondary | ICD-10-CM

## 2011-09-26 DIAGNOSIS — Z79899 Other long term (current) drug therapy: Secondary | ICD-10-CM

## 2011-09-26 DIAGNOSIS — B20 Human immunodeficiency virus [HIV] disease: Secondary | ICD-10-CM

## 2011-09-26 LAB — COMPREHENSIVE METABOLIC PANEL
ALT: 17 U/L (ref 0–53)
CO2: 21 mEq/L (ref 19–32)
Calcium: 8.9 mg/dL (ref 8.4–10.5)
Chloride: 108 mEq/L (ref 96–112)
Creat: 0.83 mg/dL (ref 0.50–1.35)
Glucose, Bld: 109 mg/dL — ABNORMAL HIGH (ref 70–99)
Sodium: 140 mEq/L (ref 135–145)
Total Protein: 7.6 g/dL (ref 6.0–8.3)

## 2011-09-26 LAB — LIPID PANEL: Cholesterol: 168 mg/dL (ref 0–200)

## 2011-09-26 LAB — CBC
Hemoglobin: 13.6 g/dL (ref 13.0–17.0)
MCH: 29.9 pg (ref 26.0–34.0)
MCV: 90.1 fL (ref 78.0–100.0)
RBC: 4.55 MIL/uL (ref 4.22–5.81)

## 2011-09-27 LAB — T-HELPER CELL (CD4) - (RCID CLINIC ONLY)
CD4 % Helper T Cell: 23 % — ABNORMAL LOW (ref 33–55)
CD4 T Cell Abs: 430 uL (ref 400–2700)

## 2011-10-10 ENCOUNTER — Encounter: Payer: Self-pay | Admitting: Infectious Diseases

## 2011-10-10 ENCOUNTER — Ambulatory Visit (INDEPENDENT_AMBULATORY_CARE_PROVIDER_SITE_OTHER): Payer: 59 | Admitting: Infectious Diseases

## 2011-10-10 VITALS — BP 176/123 | HR 105 | Temp 97.8°F | Ht 74.0 in | Wt 171.0 lb

## 2011-10-10 DIAGNOSIS — B20 Human immunodeficiency virus [HIV] disease: Secondary | ICD-10-CM

## 2011-10-10 DIAGNOSIS — Z23 Encounter for immunization: Secondary | ICD-10-CM

## 2011-10-10 DIAGNOSIS — F17200 Nicotine dependence, unspecified, uncomplicated: Secondary | ICD-10-CM

## 2011-10-10 DIAGNOSIS — I10 Essential (primary) hypertension: Secondary | ICD-10-CM

## 2011-10-10 NOTE — Assessment & Plan Note (Signed)
He is doing well, will continue his current rx. He is offered condoms but states he is not sexually active. He gets flu shot today. Will see him back in 6 months.

## 2011-10-10 NOTE — Progress Notes (Signed)
  Subjective:    Patient ID: Gregory Whitney, male    DOB: 21-May-1976, 36 y.o.   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 CD4 was 90 on 10-2009. On 11-16-09 was started on atripla and was undetectable 2 months later.  He has been taking atenolol irregularly. Has been back on since beginning of February since he started smoking. Missed last nights dose. Has not had any sfx from meds. Occas headaches, nothing out of the ordinary. No CP.   HIV 1 RNA Quant (copies/mL)  Date Value  09/26/2011 21   10/31/2010 26*  06/07/2010 <20 copies/mL      CD4 T Cell Abs (cmm)  Date Value  09/26/2011 430   10/31/2010 210*  06/07/2010 190*      Review of Systems  Constitutional: Negative for fever, chills, appetite change and unexpected weight change.  Gastrointestinal: Negative for nausea and diarrhea.  Genitourinary: Negative for dysuria.  Neurological: Positive for headaches.       Objective:   Physical Exam  Constitutional: He appears well-developed and well-nourished.  HENT:  Mouth/Throat: No oropharyngeal exudate.  Eyes: EOM are normal. Pupils are equal, round, and reactive to light.  Neck: Neck supple.  Cardiovascular: Normal rate, regular rhythm and normal heart sounds.   Pulmonary/Chest: Effort normal and breath sounds normal.  Abdominal: Soft. Bowel sounds are normal. He exhibits no distension. There is no tenderness.  Lymphadenopathy:    He has no cervical adenopathy.          Assessment & Plan:

## 2011-10-10 NOTE — Assessment & Plan Note (Signed)
He is encouraged to quit smoking, he is aware of this.

## 2011-10-10 NOTE — Assessment & Plan Note (Signed)
Despite his reading, he is asx. He will take his medicine more consistently and then get his BP rechecked. He will call us with result.

## 2011-12-17 ENCOUNTER — Other Ambulatory Visit: Payer: Self-pay | Admitting: *Deleted

## 2011-12-17 DIAGNOSIS — I10 Essential (primary) hypertension: Secondary | ICD-10-CM

## 2011-12-17 MED ORDER — ATENOLOL 50 MG PO TABS: 50.0000 mg | ORAL_TABLET | Freq: Every day | ORAL | Status: DC

## 2012-01-07 ENCOUNTER — Other Ambulatory Visit: Payer: Self-pay | Admitting: Licensed Clinical Social Worker

## 2012-01-07 DIAGNOSIS — B20 Human immunodeficiency virus [HIV] disease: Secondary | ICD-10-CM

## 2012-01-07 MED ORDER — EFAVIRENZ-EMTRICITAB-TENOFOVIR 600-200-300 MG PO TABS: 1.0000 | ORAL_TABLET | Freq: Every day | ORAL | Status: DC

## 2012-01-08 ENCOUNTER — Other Ambulatory Visit: Payer: Self-pay | Admitting: Licensed Clinical Social Worker

## 2012-01-08 DIAGNOSIS — B20 Human immunodeficiency virus [HIV] disease: Secondary | ICD-10-CM

## 2012-01-08 MED ORDER — EFAVIRENZ-EMTRICITAB-TENOFOVIR 600-200-300 MG PO TABS: 1.0000 | ORAL_TABLET | Freq: Every day | ORAL | Status: DC

## 2012-04-25 ENCOUNTER — Other Ambulatory Visit: Payer: 59

## 2012-05-08 ENCOUNTER — Ambulatory Visit: Payer: 59 | Admitting: Infectious Diseases

## 2012-05-28 ENCOUNTER — Other Ambulatory Visit: Payer: 59

## 2012-06-11 ENCOUNTER — Ambulatory Visit: Payer: 59 | Admitting: Infectious Diseases

## 2012-09-09 ENCOUNTER — Other Ambulatory Visit: Payer: 59

## 2012-09-24 ENCOUNTER — Other Ambulatory Visit: Payer: 59

## 2012-09-24 ENCOUNTER — Other Ambulatory Visit (HOSPITAL_COMMUNITY)
Admission: RE | Admit: 2012-09-24 | Discharge: 2012-09-24 | Disposition: A | Discharge status: Home / Self Care | Payer: 59 | Source: Ambulatory Visit | Attending: Infectious Diseases | Admitting: Infectious Diseases

## 2012-09-24 ENCOUNTER — Other Ambulatory Visit: Payer: Self-pay | Admitting: Infectious Disease

## 2012-09-24 DIAGNOSIS — B20 Human immunodeficiency virus [HIV] disease: Secondary | ICD-10-CM

## 2012-09-24 DIAGNOSIS — Z113 Encounter for screening for infections with a predominantly sexual mode of transmission: Secondary | ICD-10-CM | POA: Insufficient documentation

## 2012-09-24 LAB — CBC
Hemoglobin: 13.5 g/dL (ref 13.0–17.0)
MCH: 29.2 pg (ref 26.0–34.0)
MCHC: 34.4 g/dL (ref 30.0–36.0)
MCV: 84.7 fL (ref 78.0–100.0)
Platelets: 298 10*3/uL (ref 150–400)
RBC: 4.63 MIL/uL (ref 4.22–5.81)

## 2012-09-24 LAB — COMPREHENSIVE METABOLIC PANEL
Alkaline Phosphatase: 77 U/L (ref 39–117)
CO2: 26 mEq/L (ref 19–32)
Creat: 0.65 mg/dL (ref 0.50–1.35)
Glucose, Bld: 81 mg/dL (ref 70–99)
Total Bilirubin: 0.2 mg/dL — ABNORMAL LOW (ref 0.3–1.2)

## 2012-09-25 LAB — T-HELPER CELL (CD4) - (RCID CLINIC ONLY)
CD4 % Helper T Cell: 28 % — ABNORMAL LOW (ref 33–55)
CD4 T Cell Abs: 400 uL (ref 400–2700)

## 2012-10-22 ENCOUNTER — Ambulatory Visit: Payer: 59 | Admitting: Infectious Diseases

## 2012-10-29 ENCOUNTER — Ambulatory Visit (INDEPENDENT_AMBULATORY_CARE_PROVIDER_SITE_OTHER): Payer: 59 | Admitting: Infectious Diseases

## 2012-10-29 ENCOUNTER — Encounter: Payer: Self-pay | Admitting: Infectious Diseases

## 2012-10-29 VITALS — BP 192/123 | HR 106 | Temp 98.3°F | Ht 74.0 in | Wt 168.0 lb

## 2012-10-29 DIAGNOSIS — Z79899 Other long term (current) drug therapy: Secondary | ICD-10-CM

## 2012-10-29 DIAGNOSIS — F17200 Nicotine dependence, unspecified, uncomplicated: Secondary | ICD-10-CM

## 2012-10-29 DIAGNOSIS — M2550 Pain in unspecified joint: Secondary | ICD-10-CM

## 2012-10-29 DIAGNOSIS — I10 Essential (primary) hypertension: Secondary | ICD-10-CM

## 2012-10-29 DIAGNOSIS — B20 Human immunodeficiency virus [HIV] disease: Secondary | ICD-10-CM

## 2012-10-29 DIAGNOSIS — Z113 Encounter for screening for infections with a predominantly sexual mode of transmission: Secondary | ICD-10-CM

## 2012-10-29 DIAGNOSIS — Z23 Encounter for immunization: Secondary | ICD-10-CM

## 2012-10-29 MED ORDER — ATENOLOL 50 MG PO TABS: 50.0000 mg | ORAL_TABLET | Freq: Every day | ORAL | Status: DC

## 2012-10-29 MED ORDER — MELOXICAM 7.5 MG PO TABS: 7.5000 mg | ORAL_TABLET | Freq: Every day | ORAL | Status: DC

## 2012-10-29 NOTE — Assessment & Plan Note (Signed)
Offered to give pt rx now (clonidine) but states he will go to pharm and get his rx. Will have him back in 6 weeks for recheck. 4-5 mos for visit.

## 2012-10-29 NOTE — Addendum Note (Signed)
Addended by: Jauan Wohl C on: 10/29/2012 09:34 AM   Modules accepted: Orders

## 2012-10-29 NOTE — Progress Notes (Signed)
  Subjective:    Patient ID: Gregory Whitney, male    DOB: October 12, 1975, 37 y.o.   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 CD4 was 90 on 10-2009. On 11-16-09 was started on atripla and was undetectable 2 months later. He has HTN but has been taking atenolol irregularly. Off for at least 1 year.  Having some muscle, joint pain. Has previously taken mobic but refill ran out.  Has not cut back on smoking at all.   Review of Systems  Constitutional: Negative for appetite change and unexpected weight change.  Respiratory: Negative for chest tightness.   Gastrointestinal: Negative for diarrhea and constipation.  Genitourinary: Negative for difficulty urinating.  Musculoskeletal: Positive for myalgias.  Neurological: Negative for dizziness and headaches.       Objective:   Physical Exam  Constitutional: He appears well-developed and well-nourished.  HENT:  Mouth/Throat: No oropharyngeal exudate.  Eyes: EOM are normal. Pupils are equal, round, and reactive to light.  Neck: Neck supple.  Cardiovascular: Normal rate, regular rhythm and normal heart sounds.   Pulmonary/Chest: Effort normal and breath sounds normal.  Abdominal: Soft. Bowel sounds are normal. There is no tenderness.  Musculoskeletal: He exhibits no edema and no tenderness.  Lymphadenopathy:    He has no cervical adenopathy.          Assessment & Plan:

## 2012-10-29 NOTE — Assessment & Plan Note (Signed)
Will refill his mobic, encouraged him to wear brace on his R ankle as this swells after he has been on his feet, working all day.

## 2012-10-29 NOTE — Assessment & Plan Note (Signed)
He is doing well. Will get flu shot today. Offered, refused condoms. See him back in 4-5 months (mostly for BP).

## 2012-10-29 NOTE — Assessment & Plan Note (Signed)
Encouraged pt to quit.  

## 2013-02-16 ENCOUNTER — Other Ambulatory Visit: Payer: 59

## 2013-02-16 DIAGNOSIS — Z113 Encounter for screening for infections with a predominantly sexual mode of transmission: Secondary | ICD-10-CM

## 2013-02-16 DIAGNOSIS — B20 Human immunodeficiency virus [HIV] disease: Secondary | ICD-10-CM

## 2013-02-16 DIAGNOSIS — Z79899 Other long term (current) drug therapy: Secondary | ICD-10-CM

## 2013-02-16 LAB — COMPLETE METABOLIC PANEL WITHOUT GFR
ALT: 10 U/L (ref 0–53)
AST: 11 U/L (ref 0–37)
Albumin: 3.7 g/dL (ref 3.5–5.2)
Alkaline Phosphatase: 74 U/L (ref 39–117)
BUN: 11 mg/dL (ref 6–23)
CO2: 24 meq/L (ref 19–32)
Calcium: 8.6 mg/dL (ref 8.4–10.5)
Chloride: 106 meq/L (ref 96–112)
Creat: 0.75 mg/dL (ref 0.50–1.35)
GFR, Est African American: >89 mL/min
GFR, Est Non African American: >89 mL/min
Glucose, Bld: 82 mg/dL (ref 70–99)
Potassium: 4.4 meq/L (ref 3.5–5.3)
Sodium: 137 meq/L (ref 135–145)
Total Bilirubin: 0.3 mg/dL (ref 0.3–1.2)
Total Protein: 6.7 g/dL (ref 6.0–8.3)

## 2013-02-16 LAB — SYPHILIS: RPR W/REFLEX TO RPR TITER AND TREPONEMAL ANTIBODIES, TRADITIONAL SCREENING AND DIAGNOSIS ALGORITHM

## 2013-02-16 LAB — CBC
HCT: 40.8 % (ref 39.0–52.0)
MCH: 30.1 pg (ref 26.0–34.0)
MCV: 87.7 fL (ref 78.0–100.0)
Platelets: 266 10*3/uL (ref 150–400)
RBC: 4.65 MIL/uL (ref 4.22–5.81)
RDW: 15.3 % (ref 11.5–15.5)

## 2013-02-16 LAB — LIPID PANEL
Cholesterol: 158 mg/dL (ref 0–200)
Triglycerides: 91 mg/dL (ref <150)
VLDL: 18 mg/dL (ref 0–40)

## 2013-02-17 LAB — T-HELPER CELL (CD4) - (RCID CLINIC ONLY)
CD4 % Helper T Cell: 27 % — ABNORMAL LOW (ref 33–55)
CD4 T Cell Abs: 440 uL (ref 400–2700)

## 2013-03-02 ENCOUNTER — Ambulatory Visit: Payer: 59 | Admitting: Infectious Diseases

## 2013-03-18 ENCOUNTER — Ambulatory Visit: Payer: 59 | Admitting: Infectious Diseases

## 2013-03-20 ENCOUNTER — Ambulatory Visit (INDEPENDENT_AMBULATORY_CARE_PROVIDER_SITE_OTHER): Payer: 59 | Admitting: Infectious Diseases

## 2013-03-20 ENCOUNTER — Encounter: Payer: Self-pay | Admitting: Infectious Diseases

## 2013-03-20 VITALS — BP 147/108 | HR 83 | Temp 98.1°F | Ht 74.0 in | Wt 177.0 lb

## 2013-03-20 DIAGNOSIS — I10 Essential (primary) hypertension: Secondary | ICD-10-CM

## 2013-03-20 DIAGNOSIS — M2550 Pain in unspecified joint: Secondary | ICD-10-CM

## 2013-03-20 DIAGNOSIS — B20 Human immunodeficiency virus [HIV] disease: Secondary | ICD-10-CM

## 2013-03-20 MED ORDER — EFAVIRENZ-EMTRICITAB-TENOFOVIR 600-200-300 MG PO TABS: 1.0000 | ORAL_TABLET | Freq: Every day | ORAL | Status: DC

## 2013-03-20 NOTE — Assessment & Plan Note (Signed)
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 RA?

## 2013-03-20 NOTE — Progress Notes (Signed)
  Subjective:    Patient ID: Gregory Whitney, male    DOB: 28-Nov-1975, 37 y.o.   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 CD4 was 90 on 10-2009. On 11-16-09 was started on atripla and was undetectable 2 months later. He has HTN  Doing well, has continue to have swollen, aching joints in hands and shoulders. Same ortho a few years ago, not clear dx.  Has flexion issues with 5th digits on both hands, and now 4th on right.   HIV 1 RNA Quant (copies/mL)  Date Value  02/16/2013 <20   09/24/2012 <20   09/26/2011 21      CD4 T Cell Abs (cmm)  Date Value  02/16/2013 440   09/24/2012 400   09/26/2011 430      Review of Systems  Constitutional: Negative for appetite change and unexpected weight change.  Gastrointestinal: Negative for diarrhea and constipation.  Genitourinary: Negative for dysuria.  Musculoskeletal: Positive for joint swelling and arthralgias.       Objective:   Physical Exam  Constitutional: He appears well-developed and well-nourished.  HENT:  Mouth/Throat: No oropharyngeal exudate.  Eyes: EOM are normal. Pupils are equal, round, and reactive to light.  Neck: Neck supple.  Cardiovascular: Normal rate, regular rhythm and normal heart sounds.   Pulmonary/Chest: Effort normal and breath sounds normal.  Abdominal: Soft. Bowel sounds are normal. There is no tenderness.  Musculoskeletal:       Arms: Lymphadenopathy:    He has no cervical adenopathy.          Assessment & Plan:

## 2013-03-20 NOTE — Assessment & Plan Note (Signed)
He's doing very well. Vax are up to date. Offered/refused condoms. atripla refilled. Will see him back in 6 months.

## 2013-03-20 NOTE — Assessment & Plan Note (Signed)
Need to f/u, improve control.

## 2013-03-27 ENCOUNTER — Other Ambulatory Visit: Payer: Self-pay | Admitting: Infectious Diseases

## 2013-05-27 ENCOUNTER — Other Ambulatory Visit: Payer: Self-pay | Admitting: Infectious Diseases

## 2013-06-08 ENCOUNTER — Telehealth: Payer: Self-pay | Admitting: *Deleted

## 2013-06-08 NOTE — Telephone Encounter (Signed)
Dr Ines Bloomer office called to advise that this patient who was referred by Dr Ninetta Lights rescheduled several times and then today he just no showed his appt and they just wanted to let us know. Advised her will add a note to the patient chart.

## 2013-06-11 ENCOUNTER — Other Ambulatory Visit: Payer: Self-pay

## 2013-12-21 ENCOUNTER — Other Ambulatory Visit: Payer: 59

## 2013-12-22 ENCOUNTER — Other Ambulatory Visit: Payer: 59

## 2013-12-22 DIAGNOSIS — B20 Human immunodeficiency virus [HIV] disease: Secondary | ICD-10-CM

## 2013-12-22 LAB — LIPID PANEL
CHOL/HDL RATIO: 2.1 ratio
Cholesterol: 136 mg/dL (ref 0–200)
HDL: 64 mg/dL (ref >39)
LDL Cholesterol: 51 mg/dL (ref 0–99)
Triglycerides: 106 mg/dL (ref <150)
VLDL: 21 mg/dL (ref 0–40)

## 2013-12-22 LAB — COMPLETE METABOLIC PANEL WITH GFR
ALT: 11 U/L (ref 0–53)
AST: 12 U/L (ref 0–37)
Albumin: 3.8 g/dL (ref 3.5–5.2)
Alkaline Phosphatase: 89 U/L (ref 39–117)
BUN: 9 mg/dL (ref 6–23)
CALCIUM: 8.7 mg/dL (ref 8.4–10.5)
CHLORIDE: 106 meq/L (ref 96–112)
CO2: 24 meq/L (ref 19–32)
Creat: 0.72 mg/dL (ref 0.50–1.35)
GFR, Est Non African American: >89 mL/min
Glucose, Bld: 92 mg/dL (ref 70–99)
POTASSIUM: 4 meq/L (ref 3.5–5.3)
Sodium: 140 mEq/L (ref 135–145)
TOTAL PROTEIN: 7.1 g/dL (ref 6.0–8.3)
Total Bilirubin: 0.3 mg/dL (ref 0.2–1.2)

## 2013-12-22 LAB — CBC WITH DIFFERENTIAL/PLATELET
Basophils Absolute: 0 10*3/uL (ref 0.0–0.1)
Basophils Relative: 1 % (ref 0–1)
EOS PCT: 3 % (ref 0–5)
Eosinophils Absolute: 0.1 10*3/uL (ref 0.0–0.7)
HCT: 45.1 % (ref 39.0–52.0)
Hemoglobin: 15.5 g/dL (ref 13.0–17.0)
LYMPHS ABS: 1.4 10*3/uL (ref 0.7–4.0)
Lymphocytes Relative: 32 % (ref 12–46)
MCH: 30.6 pg (ref 26.0–34.0)
MCHC: 34.4 g/dL (ref 30.0–36.0)
MCV: 89 fL (ref 78.0–100.0)
Monocytes Absolute: 0.5 10*3/uL (ref 0.1–1.0)
Monocytes Relative: 12 % (ref 3–12)
NEUTROS PCT: 52 % (ref 43–77)
Neutro Abs: 2.3 10*3/uL (ref 1.7–7.7)
Platelets: 311 10*3/uL (ref 150–400)
RBC: 5.07 MIL/uL (ref 4.22–5.81)
RDW: 13.8 % (ref 11.5–15.5)
WBC: 4.4 10*3/uL (ref 4.0–10.5)

## 2013-12-23 LAB — HIV-1 RNA QUANT-NO REFLEX-BLD
HIV 1 RNA Quant: <20 copies/mL (ref <20)
HIV-1 RNA Quant, Log: <1.3 {Log} (ref <1.30)

## 2013-12-23 LAB — RPR

## 2013-12-23 LAB — HEPATITIS B SURFACE ANTIBODY,QUALITATIVE: HEP B S AB: NEGATIVE

## 2013-12-24 LAB — T-HELPER CELL (CD4) - (RCID CLINIC ONLY)
CD4 % Helper T Cell: 30 % — ABNORMAL LOW (ref 33–55)
CD4 T Cell Abs: 470 /uL (ref 400–2700)

## 2014-01-11 ENCOUNTER — Ambulatory Visit: Payer: 59 | Admitting: Infectious Diseases

## 2014-02-15 ENCOUNTER — Ambulatory Visit: Payer: 59 | Admitting: Infectious Diseases

## 2014-03-25 ENCOUNTER — Ambulatory Visit: Payer: 59 | Admitting: Infectious Diseases

## 2014-03-30 ENCOUNTER — Other Ambulatory Visit: Payer: Self-pay | Admitting: *Deleted

## 2014-03-30 ENCOUNTER — Ambulatory Visit: Payer: 59 | Admitting: Infectious Diseases

## 2014-03-30 MED ORDER — EFAVIRENZ-EMTRICITAB-TENOFOVIR 600-200-300 MG PO TABS: ORAL_TABLET | ORAL | Status: DC

## 2014-05-06 ENCOUNTER — Ambulatory Visit: Payer: 59 | Admitting: Infectious Diseases

## 2014-05-07 ENCOUNTER — Ambulatory Visit: Payer: 59 | Admitting: Infectious Diseases

## 2014-05-20 ENCOUNTER — Emergency Department (HOSPITAL_COMMUNITY)
Admission: EM | Admit: 2014-05-20 | Discharge: 2014-05-21 | Disposition: A | Discharge status: Home / Self Care | Payer: 59 | Attending: Emergency Medicine | Admitting: Emergency Medicine

## 2014-05-20 ENCOUNTER — Encounter (HOSPITAL_COMMUNITY): Payer: Self-pay | Admitting: Emergency Medicine

## 2014-05-20 DIAGNOSIS — W1830XA Fall on same level, unspecified, initial encounter: Secondary | ICD-10-CM | POA: Diagnosis not present

## 2014-05-20 DIAGNOSIS — Y9389 Activity, other specified: Secondary | ICD-10-CM | POA: Insufficient documentation

## 2014-05-20 DIAGNOSIS — Z79899 Other long term (current) drug therapy: Secondary | ICD-10-CM | POA: Diagnosis not present

## 2014-05-20 DIAGNOSIS — S82292A Other fracture of shaft of left tibia, initial encounter for closed fracture: Secondary | ICD-10-CM

## 2014-05-20 DIAGNOSIS — I10 Essential (primary) hypertension: Secondary | ICD-10-CM | POA: Diagnosis not present

## 2014-05-20 DIAGNOSIS — Y929 Unspecified place or not applicable: Secondary | ICD-10-CM | POA: Insufficient documentation

## 2014-05-20 DIAGNOSIS — S82452A Displaced comminuted fracture of shaft of left fibula, initial encounter for closed fracture: Secondary | ICD-10-CM | POA: Insufficient documentation

## 2014-05-20 DIAGNOSIS — Z720 Tobacco use: Secondary | ICD-10-CM | POA: Insufficient documentation

## 2014-05-20 DIAGNOSIS — S8992XA Unspecified injury of left lower leg, initial encounter: Secondary | ICD-10-CM | POA: Diagnosis present

## 2014-05-20 DIAGNOSIS — B20 Human immunodeficiency virus [HIV] disease: Secondary | ICD-10-CM | POA: Insufficient documentation

## 2014-05-20 DIAGNOSIS — S82252A Displaced comminuted fracture of shaft of left tibia, initial encounter for closed fracture: Secondary | ICD-10-CM | POA: Insufficient documentation

## 2014-05-20 NOTE — ED Notes (Signed)
Per EMS, pt was in swivel desk chair and fell with L leg caught in the chair. Leg twisted, pt arrives in leg brace applied by EMS, pulses present but swelling noted. Pt has had 100 mcg Fentanyl PTA.

## 2014-05-21 ENCOUNTER — Emergency Department (HOSPITAL_COMMUNITY): Payer: 59

## 2014-05-21 MED ORDER — HYDROMORPHONE HCL 1 MG/ML IJ SOLN
1.0000 mg | Freq: Once | INTRAMUSCULAR | Status: AC
  Administered 2014-05-21: 1 mg via INTRAVENOUS
  Filled 2014-05-21: qty 1

## 2014-05-21 MED ORDER — HYDROMORPHONE HCL 1 MG/ML IJ SOLN: 1.0000 mg | Freq: Once | INTRAMUSCULAR | Status: DC

## 2014-05-21 MED ORDER — OXYCODONE-ACETAMINOPHEN 5-325 MG PO TABS: 1.0000 | ORAL_TABLET | Freq: Four times a day (QID) | ORAL | Status: DC | PRN

## 2014-05-21 MED ORDER — MORPHINE SULFATE 4 MG/ML IJ SOLN
4.0000 mg | Freq: Once | INTRAMUSCULAR | Status: AC
  Administered 2014-05-21: 4 mg via INTRAVENOUS
  Filled 2014-05-21: qty 1

## 2014-05-21 NOTE — ED Provider Notes (Signed)
Patient care acquired from Dalia Heading, PA-C in orthopedic consultation.   Patient is a 38 year old male past medical history significant for HIV, hypertension presenting to the emergency department after a mechanical fall causing on his left leg.    Patient has swelling in the proximal portion of his tibia and fibula. Compartment is otherwise soft aside from localized swelling. Sensation is grossly intact distal to injury. Pulses are intact distal to injury.  Results for orders placed in visit on 12/22/13  CBC WITH DIFFERENTIAL      Result Value Ref Range   WBC 4.4  4.0 - 10.5 K/uL   RBC 5.07  4.22 - 5.81 MIL/uL   Hemoglobin 15.5  13.0 - 17.0 g/dL   HCT 45.1  39.0 - 52.0 %   MCV 89.0  78.0 - 100.0 fL   MCH 30.6  26.0 - 34.0 pg   MCHC 34.4  30.0 - 36.0 g/dL   RDW 13.8  11.5 - 15.5 %   Platelets 311  150 - 400 K/uL   Neutrophils Relative % 52  43 - 77 %   Neutro Abs 2.3  1.7 - 7.7 K/uL   Lymphocytes Relative 32  12 - 46 %   Lymphs Abs 1.4  0.7 - 4.0 K/uL   Monocytes Relative 12  3 - 12 %   Monocytes Absolute 0.5  0.1 - 1.0 K/uL   Eosinophils Relative 3  0 - 5 %   Eosinophils Absolute 0.1  0.0 - 0.7 K/uL   Basophils Relative 1  0 - 1 %   Basophils Absolute 0.0  0.0 - 0.1 K/uL   Smear Review Criteria for review not met    COMPLETE METABOLIC PANEL WITH GFR      Result Value Ref Range   Sodium 140  135 - 145 mEq/L   Potassium 4.0  3.5 - 5.3 mEq/L   Chloride 106  96 - 112 mEq/L   CO2 24  19 - 32 mEq/L   Glucose, Bld 92  70 - 99 mg/dL   BUN 9  6 - 23 mg/dL   Creat 0.72  0.50 - 1.35 mg/dL   Total Bilirubin 0.3  0.2 - 1.2 mg/dL   Alkaline Phosphatase 89  39 - 117 U/L   AST 12  0 - 37 U/L   ALT 11  0 - 53 U/L   Total Protein 7.1  6.0 - 8.3 g/dL   Albumin 3.8  3.5 - 5.2 g/dL   Calcium 8.7  8.4 - 10.5 mg/dL   GFR, Est African American >89     GFR, Est Non African American >89    HIV 1 RNA QUANT-NO REFLEX-BLD      Result Value Ref Range   HIV 1 RNA Quant <20  <20 copies/mL    HIV1 RNA Quant, Log <1.30  <1.30 log 10  LIPID PANEL      Result Value Ref Range   Cholesterol 136  0 - 200 mg/dL   Triglycerides 106  <150 mg/dL   HDL 64  >39 mg/dL   Total CHOL/HDL Ratio 2.1     VLDL 21  0 - 40 mg/dL   LDL Cholesterol 51  0 - 99 mg/dL  RPR      Result Value Ref Range   RPR NON REAC  NON REAC  HEPATITIS B SURFACE ANTIBODY      Result Value Ref Range   Hep B S Ab NEG  NEGATIVE  T-HELPER CELL (CD4)  Result Value Ref Range   CD4 T Cell Abs 470  400 - 2700 /uL   CD4 % Helper T Cell 30 (*) 33 - 55 %   Dg Tibia/fibula Left  05/21/2014   CLINICAL DATA:  The initial valuation for acute traumatic injury. Pain, swelling, deformity.  EXAM: LEFT TIBIA AND FIBULA - 2 VIEW  COMPARISON:  None.  FINDINGS: There is an acute comminuted fracture of the proximal fibular shaft with slight lateral displacement.  Additional acute oblique comminuted fracture of the proximal tibial shaft present with slight lateral and posterior displacement. The main butterfly fragment is grossly aligned.  Diffuse soft tissue swelling seen about the proximal leg. No emphysema to suggest open fracture.  IMPRESSION: 1. Acute oblique comminuted fracture of the proximal tibial shaft with slight lateral displacement. 2. Acute comminuted fracture of the proximal fibular shaft with slight lateral displacement.   Electronically Signed   By: Jeannine Boga M.D.   On: 05/21/2014 01:52   2:09 AM Discussed patient with Dr. Tonita Cong due to volume at [facility] as on call has requested defer to on call at [facility] for the evening. Discussed this with the patient who is agreeable to be followed by Dr. Percell Miller.  Dr. Percell Miller consulted and recommends splinting, crutches with followup in his office in the morning.  SPLINT APPLICATION Date/Time: 9:37 AM Authorized by: Harlow Mares Consent: Verbal consent obtained. Risks and benefits: risks, benefits and alternatives were discussed Consent given by: patient Splint applied  by: orthopedic technician Location details: left tib/fib Splint type: posterior, stir up Supplies used: plaster Post-procedure: The splinted body part was neurovascularly unchanged following the procedure. Patient tolerance: Patient tolerated the procedure well with no immediate complications.  Medications  morphine 4 MG/ML injection 4 mg (4 mg Intravenous Given 05/21/14 0039)  HYDROmorphone (DILAUDID) injection 1 mg (1 mg Intravenous Given 05/21/14 0129)  HYDROmorphone (DILAUDID) injection 1 mg (1 mg Intravenous Given 05/21/14 0238)      1. Closed tibial fracture, left, initial encounter    Filed Vitals:   05/21/14 0301  BP:   Pulse:   Temp:   Resp: 17   Afebrile, NAD, non-toxic appearing, AAOx4.   I have reviewed nursing notes, vital signs, and all appropriate lab and imaging results for this patient. Neurovascularly intact. Normal sensation. No evidence of compartment syndrome. Discussed patient with Dr. Percell Miller who will followup in his office this morning. Patient is agreeable to discharge home with followup as an outpatient. Return for options were discussed. Patient is agreeable to plan. Patient stable at time of discharge.   Patient d/w with Dr. Tomi Bamberger, agrees with plan.    Stephani Police Piepenbrink, PA-C 05/21/14 0600

## 2014-05-21 NOTE — ED Provider Notes (Signed)
Pt fell out of a swivel chair and has an injury to his LLE.   Pt has mild diffuse swelling of his LLE, has intact pulses and intact sensation to light touch of toes.    Medical screening examination/treatment/procedure(s) were conducted as a shared visit with non-physician practitioner(s) and myself.  I personally evaluated the patient during the encounter.   EKG Interpretation None       Devoria AlbeIva Norvella Loscalzo, MD, Armando GangFACEP   Ward GivensIva L Meigan Pates, MD 05/21/14 [phone]

## 2014-05-21 NOTE — Discharge Instructions (Signed)
Please call Dr. Greig Right office first thing this morning to get your scheduled appointment for today. Please use crutches and be non-weight bearing on your leg. Please take pain medication and/or muscle relaxants as prescribed and as needed for pain. Please do not drive on narcotic pain medication or on muscle relaxants. Please elevate your leg above your heart while resting.  Please read all discharge instructions and return precautions.   Tibial and Fibular Fracture, Adult Tibial and fibular fracture is a break in the bones of your lower leg (tibia and fibula). The tibia is the larger of these two bones. The fibula is the smaller of the two bones. It is on the outer side of your leg.  CAUSES  Low-energy injuries, such as a fall from ground level.  High-energy injuries, such as motor vehicle injuries, gunshot wounds, or high-speed sports collisions. RISK FACTORS  Jumping activities.  Repetitive stress, such as long-distance running.  Participation in sports.  Osteoporosis.  Advanced age. SIGNS AND SYMPTOMS  Pain.  Swelling.  Inability to put weight on your injured leg.  Bone deformities at the site of your injury.  Bruising. DIAGNOSIS  Tibial and fibular fractures are diagnosed with the use of X-ray exams. TREATMENT  If you have a simple fracture of these two bones, they can be treated with simple immobilization. A cast or splint will be used on your leg to keep it from moving while it heals. Then you can begin range-of-motion exercises to regain your knee motion. HOME CARE INSTRUCTIONS   Apply ice to your leg:  Put ice in a plastic bag.  Place a towel between your skin and the bag.  Leave the ice on for 20 minutes, 2-3 times a day.  If you have a plaster or fiberglass cast:  Do not try to scratch the skin under the cast using sharp or pointed objects.  Check the skin around the cast every day. You may put lotion on any red or sore areas.  Keep your cast dry and  clean.  If you have a plaster splint:  Wear the splint as directed.  You may loosen the elastic around the splint if your toes become numb, tingle, or turn cold or blue.  Do not put pressure on any part of your cast or splint until it is fully hardened, because it may deform.  Your cast or splint can be protected during bathing with a plastic bag. Do not lower the cast or splint into water.  Use crutches as directed.  Only take over-the-counter or prescription medicines for pain, discomfort, or fever as directed by your health care provider.  Follow all instructions given to you by your health care provider.  Make and keep all follow-up appointments. SEEK MEDICAL CARE IF:  Your pain is becoming worse rather than better or is not controlled with medicines.  You have increased swelling or redness in the foot.  You begin to lose feeling in your foot or toes. SEEK IMMEDIATE MEDICAL CARE IF:  You develop a cold or blue foot or toes on the injured side.  You develop severe pain in your injured leg, especially if the pain is increased with movement of your toes. MAKE SURE YOU:  Understand these instructions.  Will watch your condition.  Will get help right away if you are not doing well or get worse. Document Released: 04/14/2002 Document Revised: 05/13/2013 Document Reviewed: 03/04/2013 [facility] Patient Information 2015 [place], [place]. This information is not intended to replace advice given to  you by your health care provider. Make sure you discuss any questions you have with your health care provider.   Cast or Splint Care Casts and splints support injured limbs and keep bones from moving while they heal. It is important to care for your cast or splint at home.  HOME CARE INSTRUCTIONS  Keep the cast or splint uncovered during the drying period. It can take 24 to 48 hours to dry if it is made of plaster. A fiberglass cast will dry in less than 1 hour.  Do not rest the  cast on anything harder than a pillow for the first 24 hours.  Do not put weight on your injured limb or apply pressure to the cast until your health care provider gives you permission.  Keep the cast or splint dry. Wet casts or splints can lose their shape and may not support the limb as well. A wet cast that has lost its shape can also create harmful pressure on your skin when it dries. Also, wet skin can become infected.  Cover the cast or splint with a plastic bag when bathing or when out in the rain or snow. If the cast is on the trunk of the body, take sponge baths until the cast is removed.  If your cast does become wet, dry it with a towel or a blow dryer on the cool setting only.  Keep your cast or splint clean. Soiled casts may be wiped with a moistened cloth.  Do not place any hard or soft foreign objects under your cast or splint, such as cotton, toilet paper, lotion, or powder.  Do not try to scratch the skin under the cast with any object. The object could get stuck inside the cast. Also, scratching could lead to an infection. If itching is a problem, use a blow dryer on a cool setting to relieve discomfort.  Do not trim or cut your cast or remove padding from inside of it.  Exercise all joints next to the injury that are not immobilized by the cast or splint. For example, if you have a long leg cast, exercise the hip joint and toes. If you have an arm cast or splint, exercise the shoulder, elbow, thumb, and fingers.  Elevate your injured arm or leg on 1 or 2 pillows for the first 1 to 3 days to decrease swelling and pain.It is best if you can comfortably elevate your cast so it is higher than your heart. SEEK MEDICAL CARE IF:   Your cast or splint cracks.  Your cast or splint is too tight or too loose.  You have unbearable itching inside the cast.  Your cast becomes wet or develops a soft spot or area.  You have a bad smell coming from inside your cast.  You get an  object stuck under your cast.  Your skin around the cast becomes red or raw.  You have new pain or worsening pain after the cast has been applied. SEEK IMMEDIATE MEDICAL CARE IF:   You have fluid leaking through the cast.  You are unable to move your fingers or toes.  You have discolored (blue or white), cool, painful, or very swollen fingers or toes beyond the cast.  You have tingling or numbness around the injured area.  You have severe pain or pressure under the cast.  You have any difficulty with your breathing or have shortness of breath.  You have chest pain. Document Released: 07/20/2000 Document Revised: 05/13/2013 Document Reviewed:  01/29/2013 [employer] Patient Information 2015 [place], [place]. This information is not intended to replace advice given to you by your health care provider. Make sure you discuss any questions you have with your health care provider.

## 2014-05-21 NOTE — ED Provider Notes (Signed)
CSN: [id number]     Arrival date & time 05/20/14  2332 History   First MD Initiated Contact with Patient 05/20/14 2336     Chief Complaint  Patient presents with  . Leg Injury     (Consider location/radiation/quality/duration/timing/severity/associated sxs/prior Treatment) HPI Patient presents to the emergency department with lower leg injury that occurred just prior to arrival.  Patient, states he was in a swivel desk chair, and fell in his left leg was caught under the chair to light.  Twisted the patient, states, that he has been drinking this evening.  The patient denies chest pain, shortness breath, weakness, dizziness, headache, blurred vision, back pain, neck pain, or syncope.  The patient, states he did not take any medications other than what was given to him by EMS prior to arrival.  Movement and palpation make the pain, worse Past Medical History  Diagnosis Date  . HIV infection   . Hypertension    History reviewed. No pertinent past surgical history. Family History  Problem Relation Age of Onset  . Hypertension Father    History  Substance Use Topics  . Smoking status: Current Every Day Smoker -- 1.00 packs/day for 15 years    Types: Cigarettes  . Smokeless tobacco: Never Used  . Alcohol Use: 2.5 oz/week    5 drink(s) per week     Comment: Vodka    Review of Systems   All other systems negative except as documented in the HPI. All pertinent positives and negatives as reviewed in the HPI. Allergies  Review of patient's allergies indicates no known allergies.  Home Medications   Prior to Admission medications   Medication Sig Start Date End Date Taking? Authorizing Provider  Ascorbic Acid (VITAMIN C PO) Take 1 tablet by mouth daily.   Yes Historical Provider, MD  Cyanocobalamin (VITAMIN B-12 PO) Take 1 tablet by mouth daily.   Yes Historical Provider, MD  efavirenz-emtricitabine-tenofovir (ATRIPLA) 600-200-300 MG per tablet Take 1 tablet by mouth at bedtime.  03/20/13  Yes Ginnie SmartJeffrey C Hatcher, MD   BP 147/99  Pulse 91  Temp(Src) 98.1 F (36.7 C) (Oral)  Resp 15  SpO2 96% Physical Exam  Constitutional: He is oriented to person, place, and time. He appears well-developed and well-nourished. No distress.  HENT:  Head: Normocephalic and atraumatic.  Eyes: Pupils are equal, round, and reactive to light.  Neck: Normal range of motion. Neck supple.  Pulmonary/Chest: Effort normal.  Musculoskeletal:       Legs: Neurological: He is alert and oriented to person, place, and time.  Skin: Skin is warm and dry.    ED Course  Procedures (including critical care time)  Imaging Review Dg Tibia/fibula Left  05/21/2014   CLINICAL DATA:  The initial valuation for acute traumatic injury. Pain, swelling, deformity.  EXAM: LEFT TIBIA AND FIBULA - 2 VIEW  COMPARISON:  None.  FINDINGS: There is an acute comminuted fracture of the proximal fibular shaft with slight lateral displacement.  Additional acute oblique comminuted fracture of the proximal tibial shaft present with slight lateral and posterior displacement. The main butterfly fragment is grossly aligned.  Diffuse soft tissue swelling seen about the proximal leg. No emphysema to suggest open fracture.  IMPRESSION: 1. Acute oblique comminuted fracture of the proximal tibial shaft with slight lateral displacement. 2. Acute comminuted fracture of the proximal fibular shaft with slight lateral displacement.   Electronically Signed   By: Rise MuBenjamin  McClintock M.D.   On: 05/21/2014 01:52  Carlyle Dollyhristopher W Mileah Hemmer, PA-C 05/22/14 [phone]

## 2014-05-21 NOTE — ED Notes (Signed)
Ortho paged to apply splint. 

## 2014-05-21 NOTE — Progress Notes (Signed)
Orthopedic Tech Progress Note Patient Details:  Ardelia MemsKelly Sramek 12/03/1975 [id number]  Ortho Devices Type of Ortho Device: Stirrup splint;Short leg splint;Crutches Ortho Device/Splint Interventions: Application   Haskell Flirtewsome, Analilia Geddis M 05/21/2014, 3:00 AM

## 2014-05-21 NOTE — ED Provider Notes (Signed)
See prior note   Ward GivensIva L Lamarkus Nebel, MD 05/21/14 [phone]

## 2014-05-21 NOTE — ED Notes (Signed)
Pt to xray

## 2014-05-24 ENCOUNTER — Ambulatory Visit: Payer: 59 | Admitting: Infectious Diseases

## 2014-05-24 NOTE — ED Provider Notes (Signed)
See prior note   Pearline Yerby L Micca Matura, MD 05/24/14 0704 

## 2014-05-27 ENCOUNTER — Encounter (HOSPITAL_COMMUNITY): Payer: Self-pay | Admitting: Pharmacy Technician

## 2014-05-31 ENCOUNTER — Telehealth: Payer: Self-pay | Admitting: *Deleted

## 2014-05-31 ENCOUNTER — Ambulatory Visit: Payer: 59 | Admitting: Infectious Diseases

## 2014-05-31 ENCOUNTER — Other Ambulatory Visit: Payer: Self-pay | Admitting: Orthopedic Surgery

## 2014-05-31 DIAGNOSIS — S82252A Displaced comminuted fracture of shaft of left tibia, initial encounter for closed fracture: Secondary | ICD-10-CM

## 2014-05-31 DIAGNOSIS — S82142A Displaced bicondylar fracture of left tibia, initial encounter for closed fracture: Secondary | ICD-10-CM

## 2014-05-31 NOTE — Telephone Encounter (Signed)
Notified patient that today was his 3rd no show and he needs to be seen in our walk in clinic. He states he has never cancelled the same day, however  that is what our system shows. Told him he can be seen by a provider Monday through Thursday from 9-11 Am or 2-4 pm. Asked him to call in advance as there are occasions where there are no providers available.

## 2014-06-01 NOTE — Pre-Procedure Instructions (Addendum)
Gregory Whitney  06/01/2014   Your procedure is scheduled on:  Thursday, Nov. 5th   [facility] at 9:30 AM.  Call this number if you have problems the morning of surgery: [phone]   Remember:   Do not eat food or drink liquids after midnight Wednesday.    Take these medicines the morning of surgery with A SIP OF WATER: Oxycodone    STOP all herbel meds, nsaids (aleve,naproxen,advil,ibuprofen) 5 days prior to surgery(9/31/15) including vitamins, aspirin    Do not wear jewelry - no watches or rings.                                                                                                                                                                                                                                                  Do not wear lotions or colognes.  You may NOT wear deodorant the day of surgery.   . Men may shave face and neck.  Do not bring valuables to the hospital.  [facility] is not responsible for any belongings or valuables.               Contacts, dentures or bridgework may not be worn into surgery.  Leave suitcase in the car. After surgery it may be brought to your room.  For patients admitted to the hospital, discharge time is determined by your  treatment team.    Name and phone number of your driver:    Special Instruction  Special Instructions: [facility] - Preparing for Surgery  Before surgery, you can play an important role.  Because skin is not sterile, your skin needs to be as free of germs as possible.  You can reduce the number of germs on you skin by washing with CHG (chlorahexidine gluconate) soap before surgery.  CHG is an antiseptic cleaner which kills germs and bonds with the skin to continue killing germs even after washing.  Please DO NOT use if you have an allergy to CHG or antibacterial soaps.  If your skin becomes reddened/irritated stop using the CHG and inform your nurse when you arrive at Short  Stay.  Do not shave (including legs and underarms) for at least 48 hours prior to the first CHG shower.  You may shave your face.  Please follow these instructions carefully:  1.  Shower with CHG Soap the night before surgery and the morning of Surgery.  2.  If you choose to wash your hair, wash your hair first as usual with your normal shampoo.  3.  After you shampoo, rinse your hair and body thoroughly to remove the Shampoo.  4.  Use CHG as you would any other liquid soap.  You can apply chg directly  to the skin and wash gently with scrungie or a clean washcloth.  5.  Apply the CHG Soap to your body ONLY FROM THE NECK DOWN.  Do not use on open wounds or open sores.  Avoid contact with your eyes ears, mouth and genitals (private parts).  Wash genitals (private parts)       with your normal soap.  6.  Wash thoroughly, paying special attention to the area where your surgery will be performed.  7.  Thoroughly rinse your body with warm water from the neck down.  8.  DO NOT shower/wash with your normal soap after using and rinsing off the CHG Soap.  9.  Pat yourself dry with a clean towel.            10.  Wear clean pajamas.            11.  Place clean sheets on your bed the night of your first shower and do not sleep with pets.  Day of Surgery  Do not apply any lotions/deodorants the morning of surgery.  Please wear clean clothes to the hospital/surgery center.   Please read over the following fact sheets that you were given: Pain Booklet, Coughing and Deep Breathing and Surgical Site Infection Prevention

## 2014-06-02 ENCOUNTER — Ambulatory Visit (HOSPITAL_COMMUNITY)
Admission: RE | Admit: 2014-06-02 | Discharge: 2014-06-02 | Disposition: A | Discharge status: Home / Self Care | Payer: 59 | Source: Ambulatory Visit | Attending: Anesthesiology | Admitting: Anesthesiology

## 2014-06-02 ENCOUNTER — Encounter (HOSPITAL_COMMUNITY)
Admission: RE | Admit: 2014-06-02 | Discharge: 2014-06-02 | Disposition: A | Discharge status: Home / Self Care | Payer: 59 | Source: Ambulatory Visit | Attending: Orthopedic Surgery | Admitting: Orthopedic Surgery

## 2014-06-02 ENCOUNTER — Ambulatory Visit
Admission: RE | Admit: 2014-06-02 | Discharge: 2014-06-02 | Disposition: A | Discharge status: Home / Self Care | Payer: 59 | Source: Ambulatory Visit | Attending: Orthopedic Surgery | Admitting: Orthopedic Surgery

## 2014-06-02 ENCOUNTER — Encounter (HOSPITAL_COMMUNITY): Payer: Self-pay

## 2014-06-02 DIAGNOSIS — Z01818 Encounter for other preprocedural examination: Secondary | ICD-10-CM

## 2014-06-02 DIAGNOSIS — I10 Essential (primary) hypertension: Secondary | ICD-10-CM | POA: Insufficient documentation

## 2014-06-02 DIAGNOSIS — S82252A Displaced comminuted fracture of shaft of left tibia, initial encounter for closed fracture: Secondary | ICD-10-CM

## 2014-06-02 DIAGNOSIS — S82142A Displaced bicondylar fracture of left tibia, initial encounter for closed fracture: Secondary | ICD-10-CM

## 2014-06-02 HISTORY — DX: Unspecified osteoarthritis, unspecified site: M19.90

## 2014-06-02 HISTORY — DX: Disease of blood and blood-forming organs, unspecified: D75.9

## 2014-06-02 HISTORY — DX: Asymptomatic human immunodeficiency virus (hiv) infection status: Z21

## 2014-06-02 HISTORY — DX: Human immunodeficiency virus (HIV) disease: B20

## 2014-06-02 HISTORY — DX: Unspecified fracture of unspecified lower leg, initial encounter for closed fracture: S82.90XA

## 2014-06-02 LAB — CBC
HCT: 40 % (ref 39.0–52.0)
Hemoglobin: 13.3 g/dL (ref 13.0–17.0)
MCH: 31.2 pg (ref 26.0–34.0)
MCHC: 33.3 g/dL (ref 30.0–36.0)
MCV: 93.9 fL (ref 78.0–100.0)
PLATELETS: 463 10*3/uL — AB (ref 150–400)
RBC: 4.26 MIL/uL (ref 4.22–5.81)
RDW: 13.5 % (ref 11.5–15.5)
WBC: 5.1 10*3/uL (ref 4.0–10.5)

## 2014-06-02 LAB — COMPREHENSIVE METABOLIC PANEL
ALT: 18 U/L (ref 0–53)
ANION GAP: 15 (ref 5–15)
AST: 19 U/L (ref 0–37)
Albumin: 3.2 g/dL — ABNORMAL LOW (ref 3.5–5.2)
Alkaline Phosphatase: 92 U/L (ref 39–117)
BILIRUBIN TOTAL: 0.3 mg/dL (ref 0.3–1.2)
BUN: 16 mg/dL (ref 6–23)
CHLORIDE: 101 meq/L (ref 96–112)
CO2: 22 meq/L (ref 19–32)
CREATININE: 0.63 mg/dL (ref 0.50–1.35)
Calcium: 9 mg/dL (ref 8.4–10.5)
GFR calc Af Amer: >90 mL/min (ref >90)
Glucose, Bld: 104 mg/dL — ABNORMAL HIGH (ref 70–99)
POTASSIUM: 4.3 meq/L (ref 3.7–5.3)
Sodium: 138 mEq/L (ref 137–147)
Total Protein: 8.3 g/dL (ref 6.0–8.3)

## 2014-06-02 NOTE — Progress Notes (Signed)
Patient previously on atenolol but stopped it him self 8 months ago due to money? Needed to see dr?

## 2014-06-09 MED ORDER — CEFAZOLIN SODIUM-DEXTROSE 2-3 GM-% IV SOLR
2.0000 g | INTRAVENOUS | Status: AC
  Administered 2014-06-10: 2 g via INTRAVENOUS
  Filled 2014-06-09: qty 50

## 2014-06-10 ENCOUNTER — Encounter (HOSPITAL_COMMUNITY): Payer: Self-pay | Admitting: *Deleted

## 2014-06-10 ENCOUNTER — Encounter (HOSPITAL_COMMUNITY)
Admission: RE | Disposition: A | Discharge status: Home / Self Care | Payer: Self-pay | Source: Ambulatory Visit | Attending: Orthopedic Surgery

## 2014-06-10 ENCOUNTER — Inpatient Hospital Stay (HOSPITAL_COMMUNITY)
Admission: RE | Admit: 2014-06-10 | Discharge: 2014-06-12 | DRG: 494 | Disposition: A | Discharge status: Home / Self Care | Payer: 59 | Source: Ambulatory Visit | Attending: Orthopedic Surgery | Admitting: Orthopedic Surgery

## 2014-06-10 ENCOUNTER — Ambulatory Visit (HOSPITAL_COMMUNITY): Payer: 59 | Admitting: Certified Registered Nurse Anesthetist

## 2014-06-10 ENCOUNTER — Ambulatory Visit (HOSPITAL_COMMUNITY): Payer: 59

## 2014-06-10 ENCOUNTER — Observation Stay (HOSPITAL_COMMUNITY): Payer: 59

## 2014-06-10 DIAGNOSIS — M1990 Unspecified osteoarthritis, unspecified site: Secondary | ICD-10-CM | POA: Diagnosis present

## 2014-06-10 DIAGNOSIS — S82142A Displaced bicondylar fracture of left tibia, initial encounter for closed fracture: Principal | ICD-10-CM | POA: Diagnosis present

## 2014-06-10 DIAGNOSIS — W1789XA Other fall from one level to another, initial encounter: Secondary | ICD-10-CM | POA: Diagnosis present

## 2014-06-10 DIAGNOSIS — S82202A Unspecified fracture of shaft of left tibia, initial encounter for closed fracture: Secondary | ICD-10-CM

## 2014-06-10 DIAGNOSIS — I10 Essential (primary) hypertension: Secondary | ICD-10-CM | POA: Diagnosis present

## 2014-06-10 DIAGNOSIS — L732 Hidradenitis suppurativa: Secondary | ICD-10-CM | POA: Diagnosis present

## 2014-06-10 DIAGNOSIS — F17210 Nicotine dependence, cigarettes, uncomplicated: Secondary | ICD-10-CM | POA: Diagnosis present

## 2014-06-10 DIAGNOSIS — F1290 Cannabis use, unspecified, uncomplicated: Secondary | ICD-10-CM | POA: Diagnosis present

## 2014-06-10 DIAGNOSIS — M1992 Post-traumatic osteoarthritis, unspecified site: Secondary | ICD-10-CM | POA: Diagnosis present

## 2014-06-10 DIAGNOSIS — Z21 Asymptomatic human immunodeficiency virus [HIV] infection status: Secondary | ICD-10-CM | POA: Diagnosis present

## 2014-06-10 DIAGNOSIS — S82122D Displaced fracture of lateral condyle of left tibia, subsequent encounter for closed fracture with routine healing: Secondary | ICD-10-CM

## 2014-06-10 DIAGNOSIS — S82143A Displaced bicondylar fracture of unspecified tibia, initial encounter for closed fracture: Secondary | ICD-10-CM | POA: Diagnosis present

## 2014-06-10 HISTORY — PX: ORIF TIBIA PLATEAU: SHX2132

## 2014-06-10 HISTORY — DX: Displaced fracture of lateral condyle of left tibia, subsequent encounter for closed fracture with routine healing: S82.122D

## 2014-06-10 LAB — CBC
HCT: 37.2 % — ABNORMAL LOW (ref 39.0–52.0)
Hemoglobin: 12.2 g/dL — ABNORMAL LOW (ref 13.0–17.0)
MCH: 30.5 pg (ref 26.0–34.0)
MCHC: 32.8 g/dL (ref 30.0–36.0)
MCV: 93 fL (ref 78.0–100.0)
PLATELETS: 494 10*3/uL — AB (ref 150–400)
RBC: 4 MIL/uL — ABNORMAL LOW (ref 4.22–5.81)
RDW: 13.6 % (ref 11.5–15.5)
WBC: 7.3 10*3/uL (ref 4.0–10.5)

## 2014-06-10 LAB — CREATININE, SERUM
CREATININE: 0.69 mg/dL (ref 0.50–1.35)
GFR calc Af Amer: >90 mL/min (ref >90)

## 2014-06-10 SURGERY — OPEN REDUCTION INTERNAL FIXATION (ORIF) TIBIAL PLATEAU
Anesthesia: General | Site: Leg Lower | Laterality: Left

## 2014-06-10 MED ORDER — BACLOFEN 10 MG PO TABS: 10.0000 mg | ORAL_TABLET | Freq: Three times a day (TID) | ORAL | Status: DC

## 2014-06-10 MED ORDER — OXYCODONE HCL 5 MG PO TABS
ORAL_TABLET | ORAL | Status: AC
  Filled 2014-06-10: qty 1

## 2014-06-10 MED ORDER — MIDAZOLAM HCL 2 MG/2ML IJ SOLN
INTRAMUSCULAR | Status: AC
  Filled 2014-06-10: qty 2

## 2014-06-10 MED ORDER — HYDROMORPHONE HCL 1 MG/ML IJ SOLN
INTRAMUSCULAR | Status: AC
  Filled 2014-06-10: qty 1

## 2014-06-10 MED ORDER — HYDROMORPHONE HCL 1 MG/ML IJ SOLN
0.5000 mg | INTRAMUSCULAR | Status: DC | PRN
  Administered 2014-06-10 – 2014-06-11 (×4): 1 mg via INTRAVENOUS
  Filled 2014-06-10 (×4): qty 1

## 2014-06-10 MED ORDER — LIDOCAINE HCL (CARDIAC) 20 MG/ML IV SOLN
INTRAVENOUS | Status: DC | PRN
  Administered 2014-06-10: 80 mg via INTRAVENOUS

## 2014-06-10 MED ORDER — HYDROMORPHONE HCL 1 MG/ML IJ SOLN
0.2500 mg | INTRAMUSCULAR | Status: DC | PRN
  Administered 2014-06-10 (×4): 0.5 mg via INTRAVENOUS

## 2014-06-10 MED ORDER — CEFAZOLIN SODIUM 1-5 GM-% IV SOLN
1.0000 g | Freq: Four times a day (QID) | INTRAVENOUS | Status: AC
  Administered 2014-06-10 – 2014-06-11 (×3): 1 g via INTRAVENOUS
  Filled 2014-06-10 (×4): qty 50

## 2014-06-10 MED ORDER — NEOSTIGMINE METHYLSULFATE 10 MG/10ML IV SOLN
INTRAVENOUS | Status: DC | PRN
  Administered 2014-06-10: 3 mg via INTRAVENOUS

## 2014-06-10 MED ORDER — ONDANSETRON HCL 4 MG PO TABS: 4.0000 mg | ORAL_TABLET | Freq: Three times a day (TID) | ORAL | Status: DC | PRN

## 2014-06-10 MED ORDER — POTASSIUM CHLORIDE IN NACL 20-0.45 MEQ/L-% IV SOLN
INTRAVENOUS | Status: DC
  Administered 2014-06-10: 21:00:00 via INTRAVENOUS
  Filled 2014-06-10 (×5): qty 1000

## 2014-06-10 MED ORDER — OXYCODONE HCL 5 MG/5ML PO SOLN
5.0000 mg | Freq: Once | ORAL | Status: AC | PRN
Start: 2014-06-10 — End: 2014-06-10

## 2014-06-10 MED ORDER — GLYCOPYRROLATE 0.2 MG/ML IJ SOLN
INTRAMUSCULAR | Status: DC | PRN
  Administered 2014-06-10: 0.4 mg via INTRAVENOUS

## 2014-06-10 MED ORDER — EFAVIRENZ-EMTRICITAB-TENOFOVIR 600-200-300 MG PO TABS
1.0000 | ORAL_TABLET | Freq: Every day | ORAL | Status: DC
  Administered 2014-06-11: 1 via ORAL
  Filled 2014-06-10 (×3): qty 1

## 2014-06-10 MED ORDER — METOCLOPRAMIDE HCL 5 MG/ML IJ SOLN: 5.0000 mg | Freq: Three times a day (TID) | INTRAMUSCULAR | Status: DC | PRN

## 2014-06-10 MED ORDER — OXYCODONE-ACETAMINOPHEN 10-325 MG PO TABS
1.0000 | ORAL_TABLET | Freq: Four times a day (QID) | ORAL | Status: DC | PRN
Start: 2014-06-10 — End: 2015-08-22

## 2014-06-10 MED ORDER — ONDANSETRON HCL 4 MG/2ML IJ SOLN
INTRAMUSCULAR | Status: DC | PRN
  Administered 2014-06-10: 4 mg via INTRAVENOUS

## 2014-06-10 MED ORDER — ONDANSETRON HCL 4 MG/2ML IJ SOLN
INTRAMUSCULAR | Status: AC
  Filled 2014-06-10: qty 2

## 2014-06-10 MED ORDER — PROMETHAZINE HCL 25 MG/ML IJ SOLN: 6.2500 mg | INTRAMUSCULAR | Status: DC | PRN

## 2014-06-10 MED ORDER — ROCURONIUM BROMIDE 100 MG/10ML IV SOLN
INTRAVENOUS | Status: DC | PRN
  Administered 2014-06-10: 50 mg via INTRAVENOUS

## 2014-06-10 MED ORDER — PROPOFOL 10 MG/ML IV BOLUS
INTRAVENOUS | Status: AC
  Filled 2014-06-10: qty 20

## 2014-06-10 MED ORDER — METHOCARBAMOL 1000 MG/10ML IJ SOLN
500.0000 mg | Freq: Four times a day (QID) | INTRAVENOUS | Status: DC | PRN
  Filled 2014-06-10: qty 5

## 2014-06-10 MED ORDER — LACTATED RINGERS IV SOLN
INTRAVENOUS | Status: DC
  Administered 2014-06-10: 11:00:00 via INTRAVENOUS

## 2014-06-10 MED ORDER — POLYETHYLENE GLYCOL 3350 17 G PO PACK: 17.0000 g | PACK | Freq: Every day | ORAL | Status: DC | PRN

## 2014-06-10 MED ORDER — ONDANSETRON HCL 4 MG/2ML IJ SOLN: 4.0000 mg | Freq: Four times a day (QID) | INTRAMUSCULAR | Status: DC | PRN

## 2014-06-10 MED ORDER — SENNA-DOCUSATE SODIUM 8.6-50 MG PO TABS: 2.0000 | ORAL_TABLET | Freq: Every day | ORAL | Status: DC

## 2014-06-10 MED ORDER — DOCUSATE SODIUM 100 MG PO CAPS
100.0000 mg | ORAL_CAPSULE | Freq: Two times a day (BID) | ORAL | Status: DC
  Administered 2014-06-10 – 2014-06-12 (×4): 100 mg via ORAL
  Filled 2014-06-10 (×5): qty 1

## 2014-06-10 MED ORDER — BISACODYL 10 MG RE SUPP: 10.0000 mg | Freq: Every day | RECTAL | Status: DC | PRN

## 2014-06-10 MED ORDER — CLINDAMYCIN HCL 300 MG PO CAPS
300.0000 mg | ORAL_CAPSULE | Freq: Three times a day (TID) | ORAL | Status: DC
  Administered 2014-06-10 – 2014-06-12 (×5): 300 mg via ORAL
  Filled 2014-06-10 (×8): qty 1

## 2014-06-10 MED ORDER — FENTANYL CITRATE 0.05 MG/ML IJ SOLN
INTRAMUSCULAR | Status: DC | PRN
  Administered 2014-06-10: 100 ug via INTRAVENOUS
  Administered 2014-06-10 (×5): 50 ug via INTRAVENOUS
  Administered 2014-06-10: 100 ug via INTRAVENOUS
  Administered 2014-06-10: 50 ug via INTRAVENOUS

## 2014-06-10 MED ORDER — ONDANSETRON HCL 4 MG PO TABS: 4.0000 mg | ORAL_TABLET | Freq: Four times a day (QID) | ORAL | Status: DC | PRN

## 2014-06-10 MED ORDER — FENTANYL CITRATE 0.05 MG/ML IJ SOLN
INTRAMUSCULAR | Status: AC
  Filled 2014-06-10: qty 5

## 2014-06-10 MED ORDER — OXYCODONE-ACETAMINOPHEN 5-325 MG PO TABS
1.0000 | ORAL_TABLET | ORAL | Status: DC | PRN
  Administered 2014-06-11 – 2014-06-12 (×5): 2 via ORAL
  Filled 2014-06-10 (×5): qty 2

## 2014-06-10 MED ORDER — ENOXAPARIN SODIUM 40 MG/0.4ML ~~LOC~~ SOLN
40.0000 mg | SUBCUTANEOUS | Status: DC
  Administered 2014-06-11 – 2014-06-12 (×2): 40 mg via SUBCUTANEOUS
  Filled 2014-06-10 (×3): qty 0.4

## 2014-06-10 MED ORDER — MIDAZOLAM HCL 5 MG/5ML IJ SOLN
INTRAMUSCULAR | Status: DC | PRN
  Administered 2014-06-10: 2 mg via INTRAVENOUS

## 2014-06-10 MED ORDER — 0.9 % SODIUM CHLORIDE (POUR BTL) OPTIME
TOPICAL | Status: DC | PRN
  Administered 2014-06-10: 1000 mL

## 2014-06-10 MED ORDER — METOCLOPRAMIDE HCL 10 MG PO TABS: 5.0000 mg | ORAL_TABLET | Freq: Three times a day (TID) | ORAL | Status: DC | PRN

## 2014-06-10 MED ORDER — SENNA 8.6 MG PO TABS
1.0000 | ORAL_TABLET | Freq: Two times a day (BID) | ORAL | Status: DC
  Administered 2014-06-11 – 2014-06-12 (×2): 8.6 mg via ORAL
  Filled 2014-06-10 (×5): qty 1

## 2014-06-10 MED ORDER — MEPERIDINE HCL 25 MG/ML IJ SOLN
12.5000 mg | INTRAMUSCULAR | Status: DC | PRN
  Administered 2014-06-10: 12.5 mg via INTRAVENOUS

## 2014-06-10 MED ORDER — BUPIVACAINE HCL (PF) 0.25 % IJ SOLN
INTRAMUSCULAR | Status: AC
  Filled 2014-06-10: qty 30

## 2014-06-10 MED ORDER — OXYCODONE HCL 5 MG PO TABS
5.0000 mg | ORAL_TABLET | Freq: Once | ORAL | Status: AC | PRN
  Administered 2014-06-10: 5 mg via ORAL

## 2014-06-10 MED ORDER — METHOCARBAMOL 500 MG PO TABS
ORAL_TABLET | ORAL | Status: AC
  Filled 2014-06-10: qty 1

## 2014-06-10 MED ORDER — PROPOFOL 10 MG/ML IV BOLUS
INTRAVENOUS | Status: DC | PRN
  Administered 2014-06-10: 200 mg via INTRAVENOUS

## 2014-06-10 MED ORDER — NEOSTIGMINE METHYLSULFATE 10 MG/10ML IV SOLN
INTRAVENOUS | Status: AC
  Filled 2014-06-10: qty 1

## 2014-06-10 MED ORDER — OXYCODONE HCL 5 MG PO TABS
5.0000 mg | ORAL_TABLET | ORAL | Status: DC | PRN
  Administered 2014-06-10: 5 mg via ORAL
  Administered 2014-06-11: 10 mg via ORAL
  Filled 2014-06-10: qty 1
  Filled 2014-06-10: qty 2

## 2014-06-10 MED ORDER — MEPERIDINE HCL 25 MG/ML IJ SOLN
INTRAMUSCULAR | Status: AC
  Filled 2014-06-10: qty 1

## 2014-06-10 MED ORDER — BUPIVACAINE HCL (PF) 0.25 % IJ SOLN
INTRAMUSCULAR | Status: DC | PRN
  Administered 2014-06-10: 20 mL

## 2014-06-10 MED ORDER — LACTATED RINGERS IV SOLN
INTRAVENOUS | Status: DC | PRN
  Administered 2014-06-10 (×2): via INTRAVENOUS

## 2014-06-10 MED ORDER — GLYCOPYRROLATE 0.2 MG/ML IJ SOLN
INTRAMUSCULAR | Status: AC
  Filled 2014-06-10: qty 2

## 2014-06-10 MED ORDER — LABETALOL HCL 5 MG/ML IV SOLN
INTRAVENOUS | Status: DC | PRN
Start: 2014-06-10 — End: 2014-06-10
  Administered 2014-06-10: 5 mg via INTRAVENOUS
  Administered 2014-06-10: 10 mg via INTRAVENOUS
  Administered 2014-06-10: 5 mg via INTRAVENOUS

## 2014-06-10 MED ORDER — METHOCARBAMOL 500 MG PO TABS
500.0000 mg | ORAL_TABLET | Freq: Four times a day (QID) | ORAL | Status: DC | PRN
  Administered 2014-06-10 – 2014-06-12 (×4): 500 mg via ORAL
  Filled 2014-06-10 (×4): qty 1

## 2014-06-10 MED ORDER — DIPHENHYDRAMINE HCL 12.5 MG/5ML PO ELIX: 12.5000 mg | ORAL_SOLUTION | ORAL | Status: DC | PRN

## 2014-06-10 SURGICAL SUPPLY — 83 items
1.6MM KIRSCHNER WIRE WITH 5MM THREAD,, 150MM ×3 IMPLANT
APL SKNCLS STERI-STRIP NONHPOA (GAUZE/BANDAGES/DRESSINGS) ×2
BANDAGE ELASTIC 4 VELCRO ST LF (GAUZE/BANDAGES/DRESSINGS) ×1 IMPLANT
BANDAGE ELASTIC 6 VELCRO ST LF (GAUZE/BANDAGES/DRESSINGS) ×5 IMPLANT
BANDAGE ESMARK 6X9 LF (GAUZE/BANDAGES/DRESSINGS) ×1 IMPLANT
BENZOIN TINCTURE PRP APPL 2/3 (GAUZE/BANDAGES/DRESSINGS) ×3 IMPLANT
BLADE SURG 15 STRL LF DISP TIS (BLADE) ×1 IMPLANT
BLADE SURG 15 STRL SS (BLADE)
BLADE SURG ROTATE 9660 (MISCELLANEOUS) IMPLANT
BNDG CMPR 9X6 STRL LF SNTH (GAUZE/BANDAGES/DRESSINGS) ×2
BNDG COHESIVE 6X5 TAN STRL LF (GAUZE/BANDAGES/DRESSINGS) ×4 IMPLANT
BNDG ESMARK 6X9 LF (GAUZE/BANDAGES/DRESSINGS) ×4
BNDG GAUZE ELAST 4 BULKY (GAUZE/BANDAGES/DRESSINGS) ×4 IMPLANT
BONE CANC CHIPS 20CC PCAN1/4 (Bone Implant) ×4 IMPLANT
BOOTCOVER CLEANROOM LRG (PROTECTIVE WEAR) ×5 IMPLANT
CHIPS CANC BONE 20CC PCAN1/4 (Bone Implant) ×2 IMPLANT
CLOSURE WOUND 1/2 X4 (GAUZE/BANDAGES/DRESSINGS) ×1
COVER SURGICAL LIGHT HANDLE (MISCELLANEOUS) ×5 IMPLANT
CUFF TOURNIQUET SINGLE 34IN LL (TOURNIQUET CUFF) ×3 IMPLANT
CUFF TOURNIQUET SINGLE 44IN (TOURNIQUET CUFF) IMPLANT
DECANTER SPIKE VIAL GLASS SM (MISCELLANEOUS) IMPLANT
DRAPE C-ARM 42X72 X-RAY (DRAPES) ×4 IMPLANT
DRAPE IMP U-DRAPE 54X76 (DRAPES) ×1 IMPLANT
DRAPE OEC MINIVIEW 54X84 (DRAPES) IMPLANT
DRAPE ORTHO SPLIT 77X108 STRL (DRAPES) ×4
DRAPE PROXIMA HALF (DRAPES) ×2 IMPLANT
DRAPE SURG ORHT 6 SPLT 77X108 (DRAPES) ×4 IMPLANT
DRAPE U-SHAPE 47X51 STRL (DRAPES) ×1 IMPLANT
DRSG ADAPTIC 3X8 NADH LF (GAUZE/BANDAGES/DRESSINGS) ×1 IMPLANT
DRSG PAD ABDOMINAL 8X10 ST (GAUZE/BANDAGES/DRESSINGS) ×4 IMPLANT
DURAPREP 26ML APPLICATOR (WOUND CARE) ×4 IMPLANT
ELECT REM PT RETURN 9FT ADLT (ELECTROSURGICAL) ×4
ELECTRODE REM PT RTRN 9FT ADLT (ELECTROSURGICAL) ×2 IMPLANT
FACESHIELD WRAPAROUND (MASK) ×8 IMPLANT
FACESHIELD WRAPAROUND OR TEAM (MASK) IMPLANT
GAUZE SPONGE 4X4 12PLY STRL (GAUZE/BANDAGES/DRESSINGS) ×5 IMPLANT
GAUZE XEROFORM 1X8 LF (GAUZE/BANDAGES/DRESSINGS) ×2 IMPLANT
GLOVE BIOGEL PI ORTHO PRO SZ8 (GLOVE) ×4
GLOVE ORTHO TXT STRL SZ7.5 (GLOVE) ×7 IMPLANT
GLOVE PI ORTHO PRO STRL SZ8 (GLOVE) ×3 IMPLANT
GLOVE SURG ORTHO 8.0 STRL STRW (GLOVE) ×8 IMPLANT
GOWN STRL REUS W/ TWL LRG LVL3 (GOWN DISPOSABLE) IMPLANT
GOWN STRL REUS W/TWL LRG LVL3 (GOWN DISPOSABLE)
GRAFT BNE CANC CHIPS 1-8 20CC (Bone Implant) IMPLANT
IMMOBILIZER KNEE 22 UNIV (SOFTGOODS) ×3 IMPLANT
KIT BASIN OR (CUSTOM PROCEDURE TRAY) ×4 IMPLANT
KIT ROOM TURNOVER OR (KITS) ×4 IMPLANT
MANIFOLD NEPTUNE II (INSTRUMENTS) ×4 IMPLANT
NEEDLE 22X1 1/2 (OR ONLY) (NEEDLE) ×3 IMPLANT
NS IRRIG 1000ML POUR BTL (IV SOLUTION) ×4 IMPLANT
PACK GENERAL/GYN (CUSTOM PROCEDURE TRAY) ×1 IMPLANT
PACK ORTHO EXTREMITY (CUSTOM PROCEDURE TRAY) ×4 IMPLANT
PACK UNIVERSAL I (CUSTOM PROCEDURE TRAY) ×1 IMPLANT
PAD ARMBOARD 7.5X6 YLW CONV (MISCELLANEOUS) ×8 IMPLANT
PAD CAST 4YDX4 CTTN HI CHSV (CAST SUPPLIES) ×2 IMPLANT
PADDING CAST COTTON 4X4 STRL (CAST SUPPLIES)
SCREW CORTEX 3.5 32MM (Screw) ×8 IMPLANT
SCREW CORTEX 3.5 40MM (Screw) ×4 IMPLANT
SCREW LOCK CORT ST 3.5X32 (Screw) ×4 IMPLANT
SCREW LOCK CORT ST 3.5X40 (Screw) ×2 IMPLANT
SCREW LOCKING SLF TAP 3.5X80MM (Screw) ×12 IMPLANT
SPONGE LAP 4X18 X RAY DECT (DISPOSABLE) ×8 IMPLANT
STAPLER VISISTAT 35W (STAPLE) ×1 IMPLANT
STOCKINETTE IMPERVIOUS LG (DRAPES) ×4 IMPLANT
STRIP CLOSURE SKIN 1/2X4 (GAUZE/BANDAGES/DRESSINGS) ×2 IMPLANT
SUCTION FRAZIER TIP 10 FR DISP (SUCTIONS) ×4 IMPLANT
SUT FIBERWIRE 2-0 18 17.9 3/8 (SUTURE) ×8
SUT MNCRL AB 4-0 PS2 18 (SUTURE) ×1 IMPLANT
SUT VIC AB 0 CT1 18XCR BRD 8 (SUTURE) ×2 IMPLANT
SUT VIC AB 0 CT1 8-18 (SUTURE) ×4
SUT VIC AB 2-0 CT1 27 (SUTURE) ×8
SUT VIC AB 2-0 CT1 TAPERPNT 27 (SUTURE) ×3 IMPLANT
SUT VIC AB 3-0 SH 8-18 (SUTURE) ×4 IMPLANT
SUTURE FIBERWR 2-0 18 17.9 3/8 (SUTURE) ×2 IMPLANT
SYR CONTROL 10ML LL (SYRINGE) ×3 IMPLANT
TOWEL OR 17X24 6PK STRL BLUE (TOWEL DISPOSABLE) ×4 IMPLANT
TOWEL OR 17X26 10 PK STRL BLUE (TOWEL DISPOSABLE) ×4 IMPLANT
TRAY FOLEY CATH 16FRSI W/METER (SET/KITS/TRAYS/PACK) IMPLANT
TUBE CONNECTING 12'X1/4 (SUCTIONS) ×1
TUBE CONNECTING 12X1/4 (SUCTIONS) ×3 IMPLANT
UNDERPAD 30X30 INCONTINENT (UNDERPADS AND DIAPERS) ×4 IMPLANT
WATER STERILE IRR 1000ML POUR (IV SOLUTION) ×4 IMPLANT
YANKAUER SUCT BULB TIP NO VENT (SUCTIONS) ×3 IMPLANT

## 2014-06-10 NOTE — Anesthesia Preprocedure Evaluation (Addendum)
Anesthesia Evaluation  Patient identified by MRN, date of birth, ID band  Reviewed: Allergy & Precautions, H&P , NPO status   Airway Mallampati: I       Dental   Pulmonary Current Smoker,  breath sounds clear to auscultation        Cardiovascular hypertension, Rhythm:Regular Rate:Normal     Neuro/Psych    GI/Hepatic   Endo/Other    Renal/GU      Musculoskeletal  (+) Arthritis -,   Abdominal   Peds  Hematology  (+) Blood dyscrasia, , HIV    Anesthesia Other Findings   Reproductive/Obstetrics                            Anesthesia Physical Anesthesia Plan  ASA: III  Anesthesia Plan: General   Post-op Pain Management:    Induction:   Airway Management Planned: Oral ETT  Additional Equipment:   Intra-op Plan:   Post-operative Plan: Extubation in OR  Informed Consent: I have reviewed the patients History and Physical, chart, labs and discussed the procedure including the risks, benefits and alternatives for the proposed anesthesia with the patient or authorized representative who has indicated his/her understanding and acceptance.   Dental advisory given  Plan Discussed with: CRNA and Surgeon  Anesthesia Plan Comments:        Anesthesia Quick Evaluation

## 2014-06-10 NOTE — Anesthesia Procedure Notes (Signed)
Procedure Name: Intubation Date/Time: 06/10/2014 1:10 PM Performed by: Adonis HousekeeperNGELL, Orian Figueira M Pre-anesthesia Checklist: Patient identified, Emergency Drugs available, Suction available and Patient being monitored Patient Re-evaluated:Patient Re-evaluated prior to inductionOxygen Delivery Method: Circle system utilized Preoxygenation: Pre-oxygenation with 100% oxygen Intubation Type: IV induction Ventilation: Mask ventilation without difficulty Laryngoscope Size: Mac and 4 Grade View: Grade I Tube type: Oral Tube size: 7.0 mm Number of attempts: 1 Airway Equipment and Method: Stylet Placement Confirmation: ETT inserted through vocal cords under direct vision,  positive ETCO2 and breath sounds checked- equal and bilateral Secured at: 21 cm Tube secured with: Tape Dental Injury: Teeth and Oropharynx as per pre-operative assessment

## 2014-06-10 NOTE — Op Note (Signed)
06/10/2014  3:51 PM  PATIENT:  Gregory Whitney    PRE-OPERATIVE DIAGNOSIS:  LEFT tibial plateau fracture that extends down into the shaft  POST-OPERATIVE DIAGNOSIS:  Same  PROCEDURE:  OPEN REDUCTION INTERNAL FIXATION (ORIF) TIBIAL PLATEAUfracture and shaft fracture  SURGEON:  Eulas PostLANDAU,Marshe Shrestha P, MD  PHYSICIAN ASSISTANT: Janace LittenBrandon Parry, OPA-C, present and scrubbed throughout the case, critical for completion in a timely fashion, and for retraction, instrumentation, and closure.  ANESTHESIA:   General  PREOPERATIVE INDICATIONS:  Gregory Whitney is a  38 y.o. male with a diagnosis of LEFT TIBIAL  FRACTURE who also has HIV, and had a substantially depressed lateral tibial plateau fracture with bone loss and displacement and elected for surgical management.  The risks benefits and alternatives were discussed with the patient preoperatively including but not limited to the risks of infection, bleeding, nerve injury, cardiopulmonary complications, the need for revision surgery, among others, and the patient was willing to proceed.We also discussed the risks of posterior Mattock osteoarthritis, the need for future knee arthroplasty.  OPERATIVE IMPLANTS: Synthes locking long tibial plate periarticular with 4 distal cortical screws, 4 proximal locking screws, 2 interfragmentary screws in the shaft.of note, the  Head of the power driver broke off within the tip of one of the screw heads, and could not be removed, and was completely flush against the head of the screw, and I was not able to retrieve this, so it was left in place. It is completely obliterating the screw head where the driver would normally fit.   OPERATIVE FINDINGS: severely depressed intra-articular comminuted lateral tibial plateau fracture involving effectively the entire hemi-condyle. There is also severe comminution of the shaft going distally.  OPERATIVE PROCEDURE: the patient was brought to the operating room and placed in the supine  position. Gen. Anesthesia was administered. IV antibiotics were given. He had a large draining purulent abscess that appeared to be in his groin, that we did not know about preoperatively. The seal this area off, and called a Gen. Surgery consult as well.  The left lower extremity was prepped and draped in usual sterile fashion. Tourniquet was placed. Time out performed. Leg was elevated and exsanguinated and tourniquet was inflated. Lateral incision was made over the tibial plateau and extended distally. The fracture was identified and a lateral arthrotomy carried out and the meniscus was reflected superiorly, and I placed a total of 2 2-0 FiberWire sutures through the meniscus.  I then booked open the fracture site, found the significantly depressed segment of bone, mobilize this which was fairly difficult, and then elevated up. I tried multiple times to elevate the plateau to be above the medial side, however the posterior cortical rim was also fractured, and I did my best to restore the joint line anatomy, although I suspect him still slightly short on the lateral side, but overall satisfactory restoration of joint alignment.  I placed a total of 20 mL of cancellus bone chips, tamped up the joint line again, and then closed the window in the lateral bone, applied the plate into the appropriate position and held provisionally with a K wire, then secured it to the shaft with a cortical screw, and then distally I placed a cortical screw in the very distal most hole, maintaining overall satisfactory alignment, slight valgus at the fracture site, and then turned my attention back to the proximal plate. 4 locking screws were placed with excellent fixation, these were 80 mm in length.  I confirm satisfactory overall alignment on AP and  lateral views, secured the distal plate with a total of 4 cortical screws, placed another interfragmentary lag screw along the segment of comminution, and then repaired the  meniscus back to the plate after taking final C-arm pictures.  During the placement of one of the distal screws percutaneously, the screwdriver head sheared off while being placed under power, and could not be retrieved. It was flush inside the screw head, and despite using a dental pick, suction, and even vibration, I was not able to get the screw to release the sheared off portion of the driver. It was left flush.  I irrigated the wounds copiously and repaired the fascia with Vicryl followed by routine closure for the skin. Sterile gauze was applied and the wounds were injected and the tourniquet was released. Total tourniquet time was 2 hours. The patient was placed in a knee immobilizer and was awakened and returned to PACU in stable and satisfactory condition. There were no complications and he tolerated the procedure well.

## 2014-06-10 NOTE — Consult Note (Signed)
Asked to see pt intraop by Dr Dion SaucierLandau for abscess left groin found while prepping for repair of left tibial plateau fracture.  Upon exam of both groin and perineum,  Pt has acute on chronic left groin hidradenitis with draining sinus tracts.  Pt has chronic right groin hidradenitis without acute changes.   Theses were probed with a Q tip and there multiple tracts and significant lichenification of tissue.  No dominant abscess  to drain.  Recommend clindamycin 300 mg po tid and warm compresses to left groin region. Will follow along for now to monitor condition given immunocompromised state. Full consult to  Follow.

## 2014-06-10 NOTE — Anesthesia Postprocedure Evaluation (Signed)
  Anesthesia Post-op Note  Patient: Gregory Whitney  Procedure(s) Performed: Procedure(s): OPEN REDUCTION INTERNAL FIXATION (ORIF) TIBIAL PLATEAU (Left)  Patient Location: PACU  Anesthesia Type:General  Level of Consciousness: awake  Airway and Oxygen Therapy: Patient Spontanous Breathing  Post-op Pain: mild  Post-op Assessment: Post-op Vital signs reviewed  Post-op Vital Signs: Reviewed  Last Vitals:  Filed Vitals:   06/10/14 1741  BP:   Pulse: 68  Temp: 36.9 C  Resp: 15    Complications: No apparent anesthesia complications

## 2014-06-10 NOTE — H&P (Signed)
PREOPERATIVE H&P  Chief Complaint: LEFT TIBIAL  FRACTURE  HPI: Gregory Whitney is a 38 y.o. male who presents for preoperative history and physical with a diagnosis of LEFT TIBIAL  FRACTURE. Symptoms are rated as moderate to severe, and have been worsening.  This is significantly impairing activities of daily living.  He has elected for surgical management. He fell off his porch 05/21/2014 and broke his tibia. [facility] was [facility], referred to me for follow-up as an outpatient, cannot put weight on it, and has ongoing moderate pain. He does have a history of HIV, as last viral load is undetectable.  Past Medical History  Diagnosis Date  . HIV (human immunodeficiency virus infection)   . Arthritis   . Blood dyscrasia     hiv  . Leg fracture     left  . Hypertension     no med at present-stopped(atenolol) by self 8 months ago   Past Surgical History  Procedure Laterality Date  . Leg surgery Right 2011    ?boil upper thigh   History   Social History  . Marital Status: Single    Spouse Name: N/A    Number of Children: N/A  . Years of Education: N/A   Social History Main Topics  . Smoking status: Current Every Day Smoker -- 1.00 packs/day for 15 years    Types: Cigarettes  . Smokeless tobacco: Never Used  . Alcohol Use: 2.5 oz/week    5 drink(s) per week     Comment: Vodka  . Drug Use: Yes    Special: Marijuana     Comment: marijuana last 05/02/14  . Sexual Activity: Not Currently     Comment: pt. declined condoms   Other Topics Concern  . None   Social History Narrative   Family History  Problem Relation Age of Onset  . Hypertension Father    No Known Allergies Prior to Admission medications   Medication Sig Start Date End Date Taking? Authorizing Provider  Ascorbic Acid (VITAMIN C PO) Take 1 tablet by mouth daily.   Yes Historical Provider, MD  efavirenz-emtricitabine-tenofovir (ATRIPLA) 600-200-300 MG per tablet Take 1 tablet by mouth at bedtime. 03/20/13  Yes  Ginnie SmartJeffrey C Hatcher, MD  ibuprofen (ADVIL,MOTRIN) 200 MG tablet Take 600 mg by mouth every 6 (six) hours as needed for headache or moderate pain.   Yes Historical Provider, MD  oxyCODONE-acetaminophen (PERCOCET/ROXICET) 5-325 MG per tablet Take 2 tablets by mouth every 6 (six) hours as needed for severe pain.   Yes Historical Provider, MD     Positive ROS: All other systems have been reviewed and were otherwise negative with the exception of those mentioned in the HPI and as above.  Physical Exam: General: Alert, no acute distress Cardiovascular: No pedal edema Respiratory: No cyanosis, no use of accessory musculature GI: No organomegaly, abdomen is soft and non-tender Skin: No lesions in the area of chief complaint Neurologic: Sensation intact distally Psychiatric: Patient is competent for consent with normal mood and affect Lymphatic: No axillary or cervical lymphadenopathy  MUSCULOSKELETAL:left leg has sensation intact throughout, EHL and FHL are intact. He has good capillary refill in his toes. His splint does have evidence of wear he has been weightbearing on it.   Assessment: LEFT TIBIAL  FRACTURE extending from the plateau down into the shaft  Plan: Plan for Procedure(s): OPEN REDUCTION INTERNAL FIXATION (ORIF) TIBIAL PLATEAU Which will require extended plating, in order to address both the shaft fracture and the articular damage.  He has a severe amount of articular damage, and is high risk for posttraumatic arthritis.  The risks benefits and alternatives were discussed with the patient including but not limited to the risks of nonoperative treatment, versus surgical intervention including infection, bleeding, nerve injury, malunion, nonunion, the need for revision surgery, hardware prominence, hardware failure, the need for hardware removal, blood clots, cardiopulmonary complications, morbidity, mortality, among others, and they were willing to proceed.    We will also admit him  postoperatively for monitoring of his compartments clinically, to reduce the risk for compartment syndrome, as well as IV pain control.  Eulas PostLANDAU,Con Arganbright P, MD Cell [phone]   06/10/2014 12:48 PM

## 2014-06-10 NOTE — Transfer of Care (Signed)
Immediate Anesthesia Transfer of Care Note  Patient: Gregory Whitney  Procedure(s) Performed: Procedure(s): OPEN REDUCTION INTERNAL FIXATION (ORIF) TIBIAL PLATEAU (Left)  Patient Location: PACU  Anesthesia Type:General  Level of Consciousness: awake, alert , oriented and patient cooperative  Airway & Oxygen Therapy: Patient Spontanous Breathing and Patient connected to face mask oxygen  Post-op Assessment: Report given to PACU RN, Post -op Vital signs reviewed and stable and Patient moving all extremities  Post vital signs: Reviewed and stable  Complications: No apparent anesthesia complications

## 2014-06-11 ENCOUNTER — Encounter (HOSPITAL_COMMUNITY): Payer: Self-pay | Admitting: Orthopedic Surgery

## 2014-06-11 MED ORDER — CLINDAMYCIN HCL 300 MG PO CAPS: 300.0000 mg | ORAL_CAPSULE | Freq: Three times a day (TID) | ORAL | Status: DC

## 2014-06-11 NOTE — Evaluation (Signed)
Physical Therapy Evaluation Patient Details Name: Gregory Whitney MRN: [id number] DOB: Oct 03, 1975 Today's Date: 06/11/2014   History of Present Illness  38 y.o. male who presents with a diagnosis of LEFT TIBIAL PLATEAU FRACTURE. S/p ORIF on 11/5. history of HIV. pt with noted abscess on groin during admission.   Clinical Impression  Patient is s/p above surgery resulting in functional limitations due to the deficits listed below (see PT Problem List). Patient will benefit from skilled PT to increase their independence and safety with mobility to allow discharge to the venue listed below. Pt not safe to D/C home from mobility standpoint at this time. Pt also reports multiple falls in last 3 weeks due to difficulty mobilizing with crutches. Recommend RW and wheelchair with elevate leg rest for mobility upon D/C. Patient needs to practice stairs next session prior to D/C home.     Follow Up Recommendations Outpatient PT;Supervision/Assistance - 24 hour;Other (comment) (when WB status changes )    Equipment Recommendations  Rolling walker with 5" wheels;Wheelchair cushion (measurements PT);Wheelchair (measurements PT);Other (comment) (wheelchair with elevate leg rest)    Recommendations for Other Services OT consult     Precautions / Restrictions Precautions Precautions: Fall Required Braces or Orthoses: Knee Immobilizer - Left Knee Immobilizer - Left: Other (comment) (no orders; pt stated "at all times") Restrictions Weight Bearing Restrictions: Yes LLE Weight Bearing: Non weight bearing      Mobility  Bed Mobility Overal bed mobility: Needs Assistance Bed Mobility: Supine to Sit     Supine to sit: Min assist     General bed mobility comments: (A) to advance Lt LE to/off EOB  Transfers Overall transfer level: Needs assistance Equipment used: Rolling walker (2 wheeled) Transfers: Sit to/from Stand Sit to Stand: Min guard         General transfer comment: cues for technique  with RW and min guard to steady; pt required incr time due to pain an demo difficulty maintaining NWB status due to pain/numbness  Ambulation/Gait Ambulation/Gait assistance: Min guard Ambulation Distance (Feet): 6 Feet Assistive device: Rolling walker (2 wheeled) Gait Pattern/deviations: Step-to pattern Gait velocity: very decr Gait velocity interpretation: <1.8 ft/sec, indicative of risk for recurrent falls General Gait Details: cues for NWB gt sequencing and safety with RW; pt limited by pain and demo difficulty mobilizing; using shuffled Rt step to mobilize   Stairs            Wheelchair Mobility    Modified Rankin (Stroke Patients Only)       Balance Overall balance assessment: Needs assistance;History of Falls Sitting-balance support: Feet supported;Bilateral upper extremity supported Sitting balance-Leahy Scale: Poor Sitting balance - Comments: bracing/guarded at EOB due to pain; no c/o dizziness Postural control: Posterior lean Standing balance support: During functional activity;Bilateral upper extremity supported Standing balance-Leahy Scale: Poor Standing balance comment: min guard and relying on RW to maintain balance and NWB status                              Pertinent Vitals/Pain Pain Assessment: 0-10 Pain Score: 9  Pain Location: Lt LE Pain Descriptors / Indicators: Burning;Shooting Pain Intervention(s): Repositioned;Premedicated before session;Monitored during session    Home Living Family/patient expects to be discharged to:: Private residence Living Arrangements: Non-relatives/Friends Available Help at Discharge: Available 24 hours/day;Friend(s) Type of Home: House Home Access: Stairs to enter Entrance Stairs-Rails: None Entrance Stairs-Number of Steps: 3 Home Layout: One level Home Equipment: Crutches Additional Comments: pt  reports he has been ambulating on crutches for ~3 weeks and has fallen; pt with tub shower and roommates at  home     Prior Function Level of Independence: Independent               Hand Dominance   Dominant Hand: Right    Extremity/Trunk Assessment   Upper Extremity Assessment: Defer to OT evaluation           Lower Extremity Assessment: LLE deficits/detail      Cervical / Trunk Assessment: Normal  Communication   Communication: No difficulties  Cognition Arousal/Alertness: Awake/alert Behavior During Therapy: WFL for tasks assessed/performed Overall Cognitive Status: Within Functional Limits for tasks assessed                      General Comments      Exercises        Assessment/Plan    PT Assessment Patient needs continued PT services  PT Diagnosis Difficulty walking;Generalized weakness;Acute pain   PT Problem List Decreased range of motion;Decreased strength;Decreased balance;Decreased activity tolerance;Decreased mobility;Decreased knowledge of use of DME;Decreased safety awareness;Pain;Impaired sensation  PT Treatment Interventions DME instruction;Gait training;Stair training;Functional mobility training;Therapeutic activities;Therapeutic exercise;Balance training;Neuromuscular re-education;Patient/family education   PT Goals (Current goals can be found in the Care Plan section) Acute Rehab PT Goals Patient Stated Goal: to not have this much pain PT Goal Formulation: With patient Time For Goal Achievement: 06/14/14 Potential to Achieve Goals: Good    Frequency Min 4X/week   Barriers to discharge Inaccessible home environment 3 STE home    Co-evaluation               End of Session Equipment Utilized During Treatment: Left knee immobilizer Activity Tolerance: Patient limited by pain;Patient limited by fatigue Patient left: in chair;with call bell/phone within reach Nurse Communication: Mobility status;Precautions;Weight bearing status    Functional Assessment Tool Used: clinical judgement Functional Limitation: Mobility: Walking  and moving around Mobility: Walking and Moving Around Current Status ([id number]([id number]): At least 1 percent but less than 20 percent impaired, limited or restricted Mobility: Walking and Moving Around Goal Status [phone]([id number]): 0 percent impaired, limited or restricted    Time: [id number] PT Time Calculation (min): 20 min   Charges:   PT Evaluation $Initial PT Evaluation Tier I: 1 Procedure PT Treatments $Gait Training: 8-22 mins   PT G Codes:   Functional Assessment Tool Used: clinical judgement Functional Limitation: Mobility: Walking and moving around    [place], ChamberinoBrittany N, [place]  [phone] 06/11/2014, 9:09 AM

## 2014-06-11 NOTE — Care Management Note (Signed)
CARE MANAGEMENT NOTE 06/11/2014  Patient:  Ardelia MemsBRON,Gehrig   Account Number:  [id number]  Date Initiated:  06/11/2014  Documentation initiated by:  Vance PeperBRADY,Sanaai Doane  Subjective/Objective Assessment:   38 yr old male admitted s/p fall with left tibial plateau fracture. Patient had a left tibial ORIF.     Action/Plan:   Case manager spoke with patient concerning DME needs. Patient states he has support at home when discharged. Referral for DME called to [facility].   Anticipated DC Date:  06/11/2014   Anticipated DC Plan:  HOME/SELF CARE      DC Planning Services  CM consult      PAC Choice  DURABLE MEDICAL EQUIPMENT   Choice offered to / List presented to:  C-1 Patient   DME arranged  WHEELCHAIR - MANUAL  WALKER - ROLLING      DME agency  [facility]     [facility]  [facility]      Status of service:  Completed, signed off Medicare Important Message given?   (If response is "NO", the following Medicare IM given date fields will be blank) Date Medicare IM given:   Medicare IM given by:   Date Additional Medicare IM given:   Additional Medicare IM given by:    Discharge Disposition:  HOME/SELF CARE  Per UR Regulation:  Reviewed for med. necessity/level of care/duration of stay

## 2014-06-11 NOTE — Discharge Summary (Signed)
Physician Discharge Summary  Patient ID: Gregory Whitney MRN: [id number] DOB/AGE: [birth year] 39 y.o.  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 HIV (human immunodeficiency virus infection)   . Arthritis   . Blood dyscrasia     hiv  . Leg fracture     left  . Hypertension     no med at present-stopped(atenolol) by self 8 months ago  . Closed fracture of lateral portion of left tibial plateau with routine healing 06/10/2014    Surgeries: Procedure(s): OPEN REDUCTION INTERNAL FIXATION (ORIF) TIBIAL PLATEAU on 06/10/2014   Consultants (if any): Treatment Team:  [facility], MD for groin infection  Discharged Condition: Improved  Hospital Course: Gregory MemsKelly Avakian is an 38 y.o. male who was admitted 06/10/2014 with a diagnosis of Closed fracture of lateral portion of left tibial plateau with routine healing and went to the operating room on 06/10/2014 and underwent the above named procedures.    He was given perioperative antibiotics:  Anti-infectives    Start     Dose/Rate Route Frequency Ordered Stop   06/10/14 2200  efavirenz-emtricitabine-tenofovir (ATRIPLA) 600-200-300 MG per tablet 1 tablet     1 tablet Oral Daily at bedtime 06/10/14 1759     06/10/14 1930  ceFAZolin (ANCEF) IVPB 1 g/50 mL premix     1 g100 mL/hr over 30 Minutes Intravenous Every 6 hours 06/10/14 1759 06/11/14 0655   06/10/14 1900  clindamycin (CLEOCIN) capsule 300 mg     300 mg Oral 3 times per day 06/10/14 1636     06/10/14 0600  ceFAZolin (ANCEF) IVPB 2 g/50 mL premix     2 g100 mL/hr over 30 Minutes Intravenous On call to O.R. 06/09/14  1349 06/10/14 1331    .  He was given sequential compression devices, early ambulation, and xarelto for DVT prophylaxis. HE WAS GIVEN CLINDAMYCIN FOR HIS GROIN INFECTION AS RECOMMENDED BY DR. Luisa HartORNETT.  He benefited maximally from the hospital stay and there were no complications.  He was neurovascularly intact distally with some moderate swelling postoperatively, but no evidence for compartment syndrome the day after surgery.  Recent vital signs:  Filed Vitals:   06/11/14 0555  BP: 144/99  Pulse: 99  Temp: 98.6 F (37 C)  Resp: 18    Recent laboratory studies:  Lab Results  Component Value Date   HGB 12.2* 06/10/2014   HGB 13.3 06/02/2014   HGB 15.5 12/22/2013   Lab Results  Component Value Date   WBC 7.3 06/10/2014   PLT 494* 06/10/2014   No results found for: INR Lab Results  Component Value Date   NA 138 06/02/2014   K 4.3 06/02/2014   CL 101 06/02/2014   CO2 22 06/02/2014   BUN 16 06/02/2014   CREATININE 0.69 06/10/2014   GLUCOSE 104* 06/02/2014    Discharge Medications:     Medication List    STOP taking these medications        ibuprofen 200 MG tablet  Commonly known as:  ADVIL,MOTRIN     oxyCODONE-acetaminophen 5-325 MG per tablet  Commonly known as:  PERCOCET/ROXICET  Replaced by:  oxyCODONE-acetaminophen 10-325 MG per tablet  TAKE these medications        baclofen 10 MG tablet  Commonly known as:  LIORESAL  Take 1 tablet (10 mg total) by mouth 3 (three) times daily. As needed for muscle spasm     efavirenz-emtricitabine-tenofovir 600-200-300 MG per tablet  Commonly known as:  ATRIPLA  Take 1 tablet by mouth at bedtime.     ondansetron 4 MG tablet  Commonly known as:  ZOFRAN  Take 1 tablet (4 mg total) by mouth every 8 (eight) hours as needed for nausea or vomiting.     oxyCODONE-acetaminophen 10-325 MG per tablet  Commonly known as:  PERCOCET  Take 1-2 tablets by mouth every 6 (six) hours as needed for pain. MAXIMUM TOTAL  ACETAMINOPHEN DOSE IS 4000 MG PER DAY     sennosides-docusate sodium 8.6-50 MG tablet  Commonly known as:  SENOKOT-S  Take 2 tablets by mouth daily.     VITAMIN C PO  Take 1 tablet by mouth daily.        Diagnostic Studies: Dg Chest 2 View  06/02/2014   CLINICAL DATA:  Hypertension.  EXAM: CHEST  2 VIEW  COMPARISON:  None.  FINDINGS: Mediastinum and hilar structures are normal. Pleural parenchymal thickening in the left lung base with tenting of the hemidiaphragm noted. These changes are most consistent scarring. An adjacent questionable pulmonary nodule is noted in the periphery of the left lung base. Left upper lateral pleural parenchymal thickening is noted with adjacent subtle nodularity also noted. These changes may all be related to scarring however nonenhanced chest CT is suggested to exclude a pulmonary nodule. Heart size normal. Pulmonary vascularity normal. No pneumothorax. No focal bony abnormality.  IMPRESSION: 1. Pleural parenchymal thickening left lower and left upper lung peripheries. These changes are most likely related to scarring. There is associated tenting of the left hemidiaphragm.  2. Questionable nodular opacities are noted for the left lung base and left upper lung adjacent to the pleural parenchymal thickening. These changes may be related to scarring however a true pulmonary nodule cannot be excluded and non emergent nonenhanced chest CT is suggested for further evaluation.   Electronically Signed   By: Maisie Fushomas  Register   On: 06/02/2014 10:25   Dg Tibia/fibula Left  06/10/2014   CLINICAL DATA:  Open reduction and internal fixation of left tibial fracture.  EXAM: LEFT TIBIA AND FIBULA - 2 VIEW; DG C-ARM 61-120 MIN  COMPARISON:  May 21, 2014.  FINDINGS: Four intraoperative fluoroscopic images were obtained of the proximal left tibia and fibula. These images demonstrate internal fixation of comminuted fracture of proximal left tibia. Improved alignment of fracture  components is noted.  IMPRESSION: Status post internal fixation of comminuted proximal left tibial fracture.   Electronically Signed   By: Roque LiasJames  Green M.D.   On: 06/10/2014 16:08   Dg Tibia/fibula Left  05/21/2014   CLINICAL DATA:  The initial valuation for acute traumatic injury. Pain, swelling, deformity.  EXAM: LEFT TIBIA AND FIBULA - 2 VIEW  COMPARISON:  None.  FINDINGS: There is an acute comminuted fracture of the proximal fibular shaft with slight lateral displacement.  Additional acute oblique comminuted fracture of the proximal tibial shaft present with slight lateral and posterior displacement. The main butterfly fragment is grossly aligned.  Diffuse soft tissue swelling seen about the proximal leg. No emphysema to suggest open fracture.  IMPRESSION: 1. Acute oblique comminuted fracture of the proximal tibial shaft with slight lateral displacement. 2. Acute comminuted fracture of the proximal fibular  shaft with slight lateral displacement.   Electronically Signed   By: Rise Mu M.D.   On: 05/21/2014 01:52   Ct Tibia Fibula Left Wo Contrast  06/02/2014   CLINICAL DATA:  Fall. Tibia and fibula fracture. Bicondylar tibial plateau fracture. Subsequent encounter.  EXAM: CT TIBIA FIBULA LEFT WITHOUT CONTRAST  TECHNIQUE: Multidetector CT imaging was performed according to the standard protocol. Multiplanar CT image reconstructions were also generated.  COMPARISON:  05/21/2014.  FINDINGS: There is a depressed lateral tibial plateau fracture. Maximal depression of the weight-bearing surface of the tibial plateau measures 2 cm. Oblique fracture plane extends into the metaphysis. In the proximal diaphysis, there is an oblique fracture of the tibial shaft. There is 1/2 shaft width lateral displacement. Displacement measures 12 mm. Posterior displacement is also about 1/2 shaft width, measuring 12 mm as well. The tibial diaphyseal fracture is comminuted with posterior displacement and angulation of  cortical fragments. The oblique fracture extending through the metaphysis to the lateral tibial plateau fracture is nondisplaced. The proximal tibiofibular joint appears intact.  Proximal fibular diaphysis fracture is present, with a cortical shard in the medullary space of the metaphysis and diaphysis. There is mild dorsal angulation of the proximal fibular fracture. Lateral displacement of the shaft is approximately 1/2 shaft width, measuring 9 mm. The distal tibia and fibula are within normal limits. Hemarthrosis of the knee is partially visible. Diffuse edema of the leg associated with the fracture. Ankle ligaments and tendons appear within normal limits.  IMPRESSION: 1. 2 cm depression of lateral tibial plateau fracture, with oblique fracture extension into the tibial meta diaphysis, compatible with Schatzker VI fracture. 2. Mildly comminuted displaced proximal fibular diaphysis fracture.   Electronically Signed   By: Andreas Newport M.D.   On: 06/02/2014 15:07   Dg Tibia/fibula Left Port  06/10/2014   CLINICAL DATA:  Left tibia/ fibula surgery.  EXAM: PORTABLE LEFT TIBIA AND FIBULA - 2 VIEW  COMPARISON:  06/02/2014  FINDINGS: Lateral tibial plateau fracture with depressed central component. There has been application of a lateral plate spanning the plateau fracture and the extensive metaphysis and diaphysis fracture continuation. No acute hardware findings. Stable positioning of an oblique fracture through the fibular neck and upper diaphysis.  IMPRESSION: 1. No acute findings after proximal tibia ORIF. 2. Stable alignment of a proximal fibular fracture.   Electronically Signed   By: Tiburcio Pea M.D.   On: 06/10/2014 17:29   Dg C-arm 61-120 Min  06/10/2014   CLINICAL DATA:  Open reduction and internal fixation of left tibial fracture.  EXAM: LEFT TIBIA AND FIBULA - 2 VIEW; DG C-ARM 61-120 MIN  COMPARISON:  May 21, 2014.  FINDINGS: Four intraoperative fluoroscopic images were obtained of the  proximal left tibia and fibula. These images demonstrate internal fixation of comminuted fracture of proximal left tibia. Improved alignment of fracture components is noted.  IMPRESSION: Status post internal fixation of comminuted proximal left tibial fracture.   Electronically Signed   By: Roque Lias M.D.   On: 06/10/2014 16:08    Disposition: 01-Home or Self Care        Follow-up Information    Follow up with Eulas Post, MD. Schedule an appointment as soon as possible for a visit in 2 weeks.   Specialty:  Orthopedic Surgery   Contact information:   [address] [place] [address] [phone]        Signed: Eulas Post 06/11/2014, 7:11 AM

## 2014-06-11 NOTE — Consult Note (Signed)
[facility] Surgery Consult Note  Reason for Consult:Bilateral groin wounds Referring Physician: Dr. Michael Litter Whitney is an 38 y.o. male.  HPI: Gregory Whitney is a 38 y.o. male who presents with a diagnosis of LEFT TIBIALPLATEAU FRACTURE. He fell off his porch 05/21/2014 and broke his tibia. He was seen at [facility] ER, referred to me for follow-up as an outpatient, cannot put weight on LLE, and has ongoing moderate pain. He does have a history of HIV, and last viral load is undetectable. Dr. Brantley Whitney was asked to see pt intraoperatively by Dr Gregory Whitney for abscess left groin found while prepping for repair of left tibial plateau fracture.   Past Medical History  Diagnosis Date  . HIV (human immunodeficiency virus infection)   . Arthritis   . Blood dyscrasia     hiv  . Leg fracture     left  . Hypertension     no med at present-stopped(atenolol) by self 8 months ago  . Closed fracture of lateral portion of left tibial plateau with routine healing 06/10/2014    Past Surgical History  Procedure Laterality Date  . Leg surgery Right 2011    ?boil upper thigh    Family History  Problem Relation Age of Onset  . Hypertension Father     Social History:  reports that he has been smoking Cigarettes.  He has a 15 pack-year smoking history. He has never used smokeless tobacco. He reports that he drinks about 2.5 oz of alcohol per week. He reports that he uses illicit drugs (Marijuana).  Allergies: No Known Allergies  Medications: I have reviewed the patient's current medications.  Results for orders placed or performed during the hospital encounter of 06/10/14 (from the past 48 hour(s))  CBC     Status: Abnormal   Collection Time: 06/10/14  6:50 PM  Result Value Ref Range   WBC 7.3 4.0 - 10.5 K/uL   RBC 4.00 (L) 4.22 - 5.81 MIL/uL   Hemoglobin 12.2 (L) 13.0 - 17.0 g/dL   HCT 37.2 (L) 39.0 - 52.0 %   MCV 93.0 78.0 - 100.0 fL   MCH 30.5 26.0 - 34.0 pg   MCHC 32.8  30.0 - 36.0 g/dL   RDW 13.6 11.5 - 15.5 %   Platelets 494 (H) 150 - 400 K/uL  Creatinine, serum     Status: None   Collection Time: 06/10/14  6:50 PM  Result Value Ref Range   Creatinine, Ser 0.69 0.50 - 1.35 mg/dL   GFR calc non Af Amer >90 >90 mL/min   GFR calc Af Amer >90 >90 mL/min    Comment: (NOTE) The eGFR has been calculated using the CKD EPI equation. This calculation has not been validated in all clinical situations. eGFR's persistently <90 mL/min signify possible Chronic Kidney Disease.     Dg Tibia/fibula Left  06/10/2014   CLINICAL DATA:  Open reduction and internal fixation of left tibial fracture.  EXAM: LEFT TIBIA AND FIBULA - 2 VIEW; DG C-ARM 61-120 MIN  COMPARISON:  May 21, 2014.  FINDINGS: Four intraoperative fluoroscopic images were obtained of the proximal left tibia and fibula. These images demonstrate internal fixation of comminuted fracture of proximal left tibia. Improved alignment of fracture components is noted.  IMPRESSION: Status post internal fixation of comminuted proximal left tibial fracture.   Electronically Signed   By: Sabino Dick M.D.   On: 06/10/2014 16:08   Dg Tibia/fibula Left Port  06/10/2014   CLINICAL DATA:  Left tibia/ fibula surgery.  EXAM: PORTABLE LEFT TIBIA AND FIBULA - 2 VIEW  COMPARISON:  06/02/2014  FINDINGS: Lateral tibial plateau fracture with depressed central component. There has been application of a lateral plate spanning the plateau fracture and the extensive metaphysis and diaphysis fracture continuation. No acute hardware findings. Stable positioning of an oblique fracture through the fibular neck and upper diaphysis.  IMPRESSION: 1. No acute findings after proximal tibia ORIF. 2. Stable alignment of a proximal fibular fracture.   Electronically Signed   By: Jorje Guild M.D.   On: 06/10/2014 17:29   Dg C-arm 61-120 Min  06/10/2014   CLINICAL DATA:  Open reduction and internal fixation of left tibial fracture.  EXAM: LEFT  TIBIA AND FIBULA - 2 VIEW; DG C-ARM 61-120 MIN  COMPARISON:  May 21, 2014.  FINDINGS: Four intraoperative fluoroscopic images were obtained of the proximal left tibia and fibula. These images demonstrate internal fixation of comminuted fracture of proximal left tibia. Improved alignment of fracture components is noted.  IMPRESSION: Status post internal fixation of comminuted proximal left tibial fracture.   Electronically Signed   By: Sabino Dick M.D.   On: 06/10/2014 16:08    Review of Systems  Constitutional: Negative.   Musculoskeletal:       See HPI  Skin:       See HPI  Endo/Heme/Allergies:       +immunocompromised   Blood pressure 144/99, pulse 99, temperature 98.6 F (37 C), temperature source Oral, resp. rate 18, height _0  (1.88 m), weight 73.074 kg (161 lb 1.6 oz), SpO2 98 %. Physical Exam  Nursing note and vitals reviewed. Constitutional: He is oriented to person, place, and time. He appears well-developed and well-nourished. No distress.  HENT:  Head: Normocephalic and atraumatic.  Cardiovascular: Normal rate.   Respiratory: Effort normal.  Genitourinary: Testes normal and penis normal. Right testis shows no mass, no swelling and no tenderness. Left testis shows no mass, no swelling and no tenderness. Circumcised. No penile erythema or penile tenderness. No discharge found.  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 Pt has chronic right groin hidradenitis without acute changes.Theses were probed with a Q tip and there multiple tracts and significant lichenification of tissue. No induration. No erythema. No fluctuance. Minimal tenderness. Small amount of purulent drainage from left sinus tract.  Neurological: He is alert and oriented to person, place, and time.  Psychiatric: He has a normal mood and affect. His behavior is normal.    Assessment: 1. LEFT tibial plateau fx with extension into tibial  shaft. S/P ORIF 06/10/2014 2.HIV 3. Acute on chronic bilateral groin hidradenitis 4. HTN  Plan: Warm compresses TID-QID to groin with TID dressing changes. No dominant abscess to drain. Clindamycin 300 mg po TID. Office based follow up upon discharge. Will continue to follow.   Lahoma Rocker, [facility] Pager [phone] 06/11/2014, 7:44 AM

## 2014-06-11 NOTE — Plan of Care (Signed)
Problem: Phase I Progression Outcomes Goal: Pain controlled with appropriate interventions Outcome: Completed/Met Date Met:  06/11/14 Goal: Initial discharge plan identified Outcome: Completed/Met Date Met:  06/11/14  Problem: Phase II Progression Outcomes Goal: Bed to chair transfers BID Outcome: Completed/Met Date Met:  06/11/14 Goal: Post op CMS Neurovascular WDL Outcome: Completed/Met Date Met:  06/11/14 Goal: Tolerating diet Outcome: Completed/Met Date Met:  06/11/14

## 2014-06-11 NOTE — Evaluation (Signed)
Occupational Therapy Evaluation Patient Details Name: Gregory Whitney MRN: [id number] DOB: March 21, 1976 Today's Date: 06/11/2014    History of Present Illness 38 y.o. male who presents with a diagnosis of LEFT TIBIAL PLATEAU FRACTURE. S/p ORIF on 11/5. history of HIV. pt with noted abscess on groin during admission.    Clinical Impression   This 38 yo male admitted and underwent above presents to acute OT with decreased mobility, decreased balance, increased pain, NWB'ing LLE all affecting his ability to care for himself at an independent level as was pta. He will benefit from one more session of acute OT prior to D/C home.    Follow Up Recommendations  No OT follow up    Equipment Recommendations  3 in 1 bedside comode       Precautions / Restrictions Precautions Precautions: Fall Precaution Comments: He needs to sponge bath at least until he sees Dr. Dion SaucierLandau and then ask him again (per Dr. Dion SaucierLandau 2-3 weeks no showering) Required Braces or Orthoses: Knee Immobilizer - Left Knee Immobilizer - Left:  (can have off per Dr. Dion SaucierLandau for gentle ROM (nothing aggressive); needs it on if he is going to use leg lifter (which Dr. Dion SaucierLandau ok'd for him to use)) Restrictions Weight Bearing Restrictions: Yes LLE Weight Bearing: Non weight bearing      Mobility Bed Mobility               General bed mobility comments: Pt reports that he has been sleeping in a recliner except one night since he broke his leg due to his couch/bed is a fouton and sits low to the floor. The recliner is not the bed option due to his being so tall and his Bil LEs hanging off the end of the footrest thus putting added pressure on his LLE--I recommended that he get the furniture risers from [employer] and raise his fouton up so he can sit/lay on  it more easily.          ADL Overall ADL's : Needs assistance/impaired Eating/Feeding: Independent;Sitting   Grooming: Set up;Sitting   Upper Body Bathing: Set up;Sitting    Lower Body Bathing: Moderate assistance (with min guard A sit<>stand)   Upper Body Dressing : Set up;Sitting   Lower Body Dressing: Moderate assistance (with AE with min guard A sit<>stand)                 General ADL Comments: Recommended to pt that he either use athletic shorts or pants (with LLE leg of pants cut off. We tried to use wide sock aid for LLE, but unable to with KI on and at that time I had not gotten clearance from Dr. Dion SaucierLandau that pt could gently bend his LLE.               Pertinent Vitals/Pain Pain Assessment: 0-10 Pain Score: 4  Pain Location: LLE Pain Descriptors / Indicators: Aching Pain Intervention(s): Monitored during session;Repositioned     Hand Dominance Right   Extremity/Trunk Assessment Upper Extremity Assessment Upper Extremity Assessment: Overall WFL for tasks assessed           Communication Communication Communication: No difficulties   Cognition Arousal/Alertness: Awake/alert Behavior During Therapy: WFL for tasks assessed/performed Overall Cognitive Status: Within Functional Limits for tasks assessed                                Home Living Family/patient expects to be discharged to::  Private residence Living Arrangements: Non-relatives/Friends Available Help at Discharge: Available 24 hours/day;Friend(s) Type of Home: House Home Access: Stairs to enter Secretary/administratorntrance Stairs-Number of Steps: 3 Entrance Stairs-Rails: None Home Layout: One level     Bathroom Shower/Tub: Tub/shower unit;Curtain (shower head on the right) Shower/tub characteristics: Curtain FirefighterBathroom Toilet: Standard     Home Equipment: Crutches   Additional Comments: pt reports he has been ambulating on crutches for ~3 weeks and has fallen; pt with tub shower and roommates at home       Prior Functioning/Environment Level of Independence: Independent             OT Diagnosis: Generalized weakness;Acute pain   OT Problem List:  Decreased range of motion;Impaired balance (sitting and/or standing);Pain;Decreased knowledge of use of DME or AE   OT Treatment/Interventions: Self-care/ADL training;Balance training;DME and/or AE instruction;Patient/family education    OT Goals(Current goals can be found in the care plan section) Acute Rehab OT Goals Patient Stated Goal: to be able to take care of myself as much as I can OT Goal Formulation: With patient Time For Goal Achievement: 06/18/14 Potential to Achieve Goals: Good  OT Frequency: Min 2X/week              End of Session Equipment Utilized During Treatment:  (AE)  Activity Tolerance: Patient tolerated treatment well Patient left: in chair;with call bell/phone within reach   Time: 1128-1200 OT Time Calculation (min): 32 min Charges:  OT General Charges $OT Visit: 1 Procedure OT Evaluation $Initial OT Evaluation Tier I: 1 Procedure OT Treatments $Self Care/Home Management : 23-37 mins G-Codes: OT G-codes **NOT FOR INPATIENT CLASS** Functional Assessment Tool Used: Clinical observation Functional Limitation: Self care Self Care Current Status ([id number]): At least 40 percent but less than 60 percent impaired, limited or restricted Self Care Goal Status ([id number](G8988): At least 20 percent but less than 40 percent impaired, limited or restricted  Gregory Whitney, Gregory Whitney [phone] 06/11/2014, 3:09 PM

## 2014-06-11 NOTE — Plan of Care (Signed)
Problem: Consults Goal: TIB FIB Fracture Patient Education See Patient Education Module for education specifics. Outcome: Completed/Met Date Met:  06/11/14 Goal: Skin Care Protocol Initiated - if Braden Score 18 or less If consults are not indicated, leave blank or document N/A Outcome: Completed/Met Date Met:  06/11/14 Goal: Nutrition Consult-if indicated Outcome: Completed/Met Date Met:  06/11/14 Goal: Diabetes Guidelines if Diabetic/Glucose > 140 If diabetic or lab glucose is > 140 mg/dl - Initiate Diabetes/Hyperglycemia Guidelines & Document Interventions  Outcome: Completed/Met Date Met:  06/11/14  Problem: Phase I Progression Outcomes Goal: Post op CMS Neurovascular WDL Outcome: Completed/Met Date Met:  06/11/14 Goal: Clear liquids, advance diet as tolerated Outcome: Completed/Met Date Met:  06/11/14 Goal: Voiding-avoid urinary catheter unless indicated Outcome: Completed/Met Date Met:  06/11/14

## 2014-06-11 NOTE — Progress Notes (Signed)
UR completed 

## 2014-06-12 DIAGNOSIS — I10 Essential (primary) hypertension: Secondary | ICD-10-CM | POA: Diagnosis present

## 2014-06-12 DIAGNOSIS — M1992 Post-traumatic osteoarthritis, unspecified site: Secondary | ICD-10-CM | POA: Diagnosis present

## 2014-06-12 DIAGNOSIS — L732 Hidradenitis suppurativa: Secondary | ICD-10-CM | POA: Diagnosis present

## 2014-06-12 DIAGNOSIS — S82142A Displaced bicondylar fracture of left tibia, initial encounter for closed fracture: Secondary | ICD-10-CM | POA: Diagnosis present

## 2014-06-12 DIAGNOSIS — F1290 Cannabis use, unspecified, uncomplicated: Secondary | ICD-10-CM | POA: Diagnosis present

## 2014-06-12 DIAGNOSIS — W1789XA Other fall from one level to another, initial encounter: Secondary | ICD-10-CM | POA: Diagnosis present

## 2014-06-12 DIAGNOSIS — Z21 Asymptomatic human immunodeficiency virus [HIV] infection status: Secondary | ICD-10-CM | POA: Diagnosis present

## 2014-06-12 DIAGNOSIS — F17210 Nicotine dependence, cigarettes, uncomplicated: Secondary | ICD-10-CM | POA: Diagnosis present

## 2014-06-12 DIAGNOSIS — M1990 Unspecified osteoarthritis, unspecified site: Secondary | ICD-10-CM | POA: Diagnosis present

## 2014-06-12 NOTE — Discharge Instructions (Signed)
Diet: As you were doing prior to hospitalization   Shower:  May shower but keep the wounds dry, use an occlusive plastic wrap, NO SOAKING IN TUB.  If the bandage gets wet, change with a clean dry gauze.  Dressing:  You may change your dressing 3-5 days after surgery.  Then change the dressing daily with sterile gauze dressing.    There are sticky tapes (steri-strips) on your wounds and all the stitches are absorbable.  Leave the steri-strips in place when changing your dressings, they will peel off with time, usually 2-3 weeks.  Activity:  Increase activity slowly as tolerated, but follow the weight bearing instructions below.  No lifting or driving for 6 weeks.  Weight Bearing:   Non weight bearing.    To prevent constipation: you may use a stool softener such as -  Colace (over the counter) 100 mg by mouth twice a day  Drink plenty of fluids (prune juice may be helpful) and high fiber foods Miralax (over the counter) for constipation as needed.    Itching:  If you experience itching with your medications, try taking only a single pain pill, or even half a pain pill at a time.  You may take up to 10 pain pills per day, and you can also use benadryl over the counter for itching or also to help with sleep.   Precautions:  If you experience chest pain or shortness of breath - call 911 immediately for transfer to the hospital emergency department!!  If you develop a fever greater that 101 F, purulent drainage from wound, increased redness or drainage from wound, or calf pain -- Call the office at [phone]                                                Ortho Follow- Up Appointment:  Please [facility] an [facility] [phone]    ---------------------------------------------------------- General Surgery regarding your hidradenitis: 1.  Cleans and exfoliate the affected areas with soap and water until resolved, may want to look into lazer hair removal,  but this can be very expensive. 2.  Take antibiotics until they are done (Clindamycin) 3.  Keep wounds covered with dry gauze and change dressings as needed for excessive soiling.  You want to prevent contamination of the pus to your clothes, sheets, etc. 4.  Wash showers and bath tubs with bleach to prevent contamination, use a new wash cloth every day.  Recommend buying a 10-14 pack of white wash cloths so you can bleach them in the wash to prevent spread of bacteria when your hidradenitis flairs up. 5.  Once these areas are healed you can use CHLORHEXIDINE SURGICAL SCRUB (hibiclens) to wash these areas for example on Monday, Wednesday, Friday to keep bacterial levels lower. 6.  When these areas are flared up soak in hot water or apply hot packs to help them resolve on their own 7.  Do not pop, squeeze, or attempt drainage, if a boil has worsened seek medical care soon if the above recommendations are not working. 8.  At some point you may need surgical excision of the scarred areas to prevent re-occurrence.  Talk with your surgeon about this option.    Hidradenitis Suppurativa, Sweat Gland Abscess Hidradenitis suppurativa is a long lasting (chronic), uncommon disease of the sweat glands.  With this, boil-like lumps and scarring develop in the groin, some times under the arms (axillae), and under the breasts. It may also uncommonly occur behind the ears, in the crease of the buttocks, and around the genitals.  CAUSES  The cause is from a blocking of the sweat glands. They then become infected. It may cause drainage and odor. It is not contagious. So it cannot be given to someone else. It most often shows up in puberty (about 9110 to 39 years of age). But it may happen much later. It is similar to acne which is a disease of the sweat glands. This condition is slightly more common in African-Americans and women. SYMPTOMS   Hidradenitis usually starts as one or more red, tender, swellings in the groin or  under the arms (axilla).  Over a period of hours to days the lesions get larger. They often open to the skin surface, draining clear to yellow-colored fluid.  The infected area heals with scarring. DIAGNOSIS  Your caregiver makes this diagnosis by looking at you. Sometimes cultures (growing germs on plates in the lab) may be taken. This is to see what germ (bacterium) is causing the infection.  TREATMENT   Topical germ killing medicine applied to the skin (antibiotics) are the treatment of choice. Antibiotics taken by mouth (systemic) are sometimes needed when the condition is getting worse or is severe.  Avoid tight-fitting clothing which traps moisture in.  Dirt does not cause hidradenitis and it is not caused by poor hygiene.  Involved areas should be cleaned daily using an antibacterial soap. Some patients find that the liquid form of Lever 2000, applied to the involved areas as a lotion after bathing, can help reduce the odor related to this condition.  Sometimes surgery is needed to drain infected areas or remove scarred tissue. Removal of large amounts of tissue is used only in severe cases.  Birth control pills may be helpful.  Oral retinoids (vitamin A derivatives) for 6 to 12 months which are effective for acne may also help this condition.  Weight loss will improve but not cure hidradenitis. It is made worse by being overweight. But the condition is not caused by being overweight.  This condition is more common in people who have had acne.  It may become worse under stress. There is no medical cure for hidradenitis. It can be controlled, but not cured. The condition usually continues for years with periods of getting worse and getting better (remission). Document Released: 03/06/2004 Document Revised: 10/15/2011 Document Reviewed: 10/23/2013 [facility] Patient Information 2015 [place] [place]. This information is not intended to replace advice given to you by your health care  provider. Make sure you discuss any questions you have with your health care provider.

## 2014-06-12 NOTE — Progress Notes (Signed)
[facility] Progress Note  2 Days Post-Op  Subjective: Pt doing well, has no pain in his groin.  Drainage less.  Tolerating diet well.  Keeping dressing in place over drainage sites.  Objective: Vital signs in last 24 hours: Temp:  [98.7 F (37.1 C)-100.5 F (38.1 C)] 98.7 F (37.1 C) (11/07 0453) Pulse Rate:  [96-106] 100 (11/07 0453) Resp:  [18] 18 (11/07 0453) BP: (128-151)/(91-104) 128/91 mmHg (11/07 0453) SpO2:  [99 %-100 %] 100 % (11/07 0453) Last BM Date: 06/30/14  Intake/Output from previous day: 11/06 0701 - 11/07 0700 In: 720 [P.O.:720] Out: 1600 [Urine:1600] Intake/Output this shift:    PE: Gen:  Alert, NAD, pleasant Groin:  B/l hidradenitis present with multiple sinus tracts, less drainage from these sites and induration is improved, no fluctuance or significant erythema.  Non-tender.     Lab Results:   Recent Labs  06/10/14 1850  WBC 7.3  HGB 12.2*  HCT 37.2*  PLT 494*   BMET  Recent Labs  06/10/14 1850  CREATININE 0.69   PT/INR No results for input(s): LABPROT, INR in the last 72 hours. CMP     Component Value Date/Time   NA 138 06/02/2014 0946   K 4.3 06/02/2014 0946   CL 101 06/02/2014 0946   CO2 22 06/02/2014 0946   GLUCOSE 104* 06/02/2014 0946   BUN 16 06/02/2014 0946   CREATININE 0.69 06/10/2014 1850   CREATININE 0.72 12/22/2013 1624   CALCIUM 9.0 06/02/2014 0946   PROT 8.3 06/02/2014 0946   ALBUMIN 3.2* 06/02/2014 0946   AST 19 06/02/2014 0946   ALT 18 06/02/2014 0946   ALKPHOS 92 06/02/2014 0946   BILITOT 0.3 06/02/2014 0946   GFRNONAA >90 06/10/2014 1850   GFRNONAA >89 12/22/2013 1624   GFRAA >90 06/10/2014 1850   GFRAA >89 12/22/2013 1624   Lipase  No results found for: LIPASE     Studies/Results: Dg Tibia/fibula Left  06/10/2014   CLINICAL DATA:  Open reduction and internal fixation of left tibial fracture.  EXAM: LEFT TIBIA AND FIBULA - 2 VIEW; DG C-ARM 61-120 MIN  COMPARISON:  May 21, 2014.   FINDINGS: Four intraoperative fluoroscopic images were obtained of the proximal left tibia and fibula. These images demonstrate internal fixation of comminuted fracture of proximal left tibia. Improved alignment of fracture components is noted.  IMPRESSION: Status post internal fixation of comminuted proximal left tibial fracture.   Electronically Signed   By: Roque LiasJames  Green M.D.   On: 06/10/2014 16:08   Dg Tibia/fibula Left Port  06/10/2014   CLINICAL DATA:  Left tibia/ fibula surgery.  EXAM: PORTABLE LEFT TIBIA AND FIBULA - 2 VIEW  COMPARISON:  06/02/2014  FINDINGS: Lateral tibial plateau fracture with depressed central component. There has been application of a lateral plate spanning the plateau fracture and the extensive metaphysis and diaphysis fracture continuation. No acute hardware findings. Stable positioning of an oblique fracture through the fibular neck and upper diaphysis.  IMPRESSION: 1. No acute findings after proximal tibia ORIF. 2. Stable alignment of a proximal fibular fracture.   Electronically Signed   By: Tiburcio PeaJonathan  Watts M.D.   On: 06/10/2014 17:29   Dg C-arm 61-120 Min  06/10/2014   CLINICAL DATA:  Open reduction and internal fixation of left tibial fracture.  EXAM: LEFT TIBIA AND FIBULA - 2 VIEW; DG C-ARM 61-120 MIN  COMPARISON:  May 21, 2014.  FINDINGS: Four intraoperative fluoroscopic images were obtained of the proximal left tibia and fibula. These  images demonstrate internal fixation of comminuted fracture of proximal left tibia. Improved alignment of fracture components is noted.  IMPRESSION: Status post internal fixation of comminuted proximal left tibial fracture.   Electronically Signed   By: Roque LiasJames  Green M.D.   On: 06/10/2014 16:08    Anti-infectives: Anti-infectives    Start     Dose/Rate Route Frequency Ordered Stop   06/11/14 0000  clindamycin (CLEOCIN) 300 MG capsule     300 mg Oral Every 8 hours 06/11/14 0721     06/10/14 2200  efavirenz-emtricitabine-tenofovir  (ATRIPLA) 600-200-300 MG per tablet 1 tablet     1 tablet Oral Daily at bedtime 06/10/14 1759     06/10/14 1930  ceFAZolin (ANCEF) IVPB 1 g/50 mL premix     1 g100 mL/hr over 30 Minutes Intravenous Every 6 hours 06/10/14 1759 06/11/14 0655   06/10/14 1900  clindamycin (CLEOCIN) capsule 300 mg     300 mg Oral 3 times per day 06/10/14 1636     06/10/14 0600  ceFAZolin (ANCEF) IVPB 2 g/50 mL premix     2 g100 mL/hr over 30 Minutes Intravenous On call to O.R. 06/09/14 1349 06/10/14 1331       Assessment/Plan 1. LEFT tibial plateau fx with extension into tibial shaft. S/P ORIF 06/10/2014 2. HIV 3. Acute on chronic bilateral groin hidradenitis 4. HTN  Plan: 1.  Warm compresses TID-QID to groin with TID dressing changes. No abscess to drain.  2.  Clindamycin 300 mg po TID for 10 additional days at discharge 3.  Office based follow up upon discharge.  May need wider excision of these areas at some point, but not while its infected. 4.  He says he gets flair ups of this are every 3-4 months.  We've discussed at length ways to manage this at home and prevent re-occurrence 5.  Okay to d/c from our perspective, will sign off.     LOS: 2 days    Aris GeorgiaDORT, Kellyanne Ellwanger 06/12/2014, 8:38 AM Pager: [phone]

## 2014-06-12 NOTE — Progress Notes (Signed)
Physical Therapy Treatment Patient Details Name: Gregory Whitney MRN: [id number] DOB: Jun 24, 1976 Today's Date: 06/12/2014    History of Present Illness 38 y.o. male who presents with a diagnosis of LEFT TIBIAL PLATEAU FRACTURE. S/p ORIF on 11/5. history of HIV. pt with noted abscess on groin during admission.     PT Comments    Session focused on address stair management technique for D/C home today. Pt unsteady with trying to ambulate up/down step; reviewed stair management technique with wheelchair and given handout. Pt stated his roommates would be able to use that technique to get him in his house. Pt has DME in room. Will need to use wheelchair for long distance mobility.   Follow Up Recommendations  Outpatient PT;Supervision/Assistance - 24 hour;Other (comment)     Equipment Recommendations  Rolling walker with 5" wheels    Recommendations for Other Services       Precautions / Restrictions Precautions Precautions: Fall Precaution Comments: He needs to sponge bath at least until he sees Dr. Dion SaucierLandau and then ask him again (per Dr. Dion SaucierLandau 2-3 weeks no showering) Required Braces or Orthoses: Knee Immobilizer - Left Knee Immobilizer - Left: Other (comment) (gentle ROM per OT Note from MD) Restrictions Weight Bearing Restrictions: Yes LLE Weight Bearing: Non weight bearing    Mobility  Bed Mobility Overal bed mobility: Modified Independent Bed Mobility: Supine to Sit     Supine to sit: Modified independent (Device/Increase time)     General bed mobility comments: incr time and use of UEs to advance Lt LE to/off EOB  Transfers Overall transfer level: Needs assistance Equipment used: Rolling walker (2 wheeled) Transfers: Sit to/from Stand Sit to Stand: Supervision         General transfer comment: supervision for safety only; no LOB or sway with transfers  Ambulation/Gait Ambulation/Gait assistance: Supervision Ambulation Distance (Feet): 30 Feet Assistive device:  Rolling walker (2 wheeled) Gait Pattern/deviations: Step-to pattern (NWB Lt LE) Gait velocity: very decr Gait velocity interpretation: Below normal speed for age/gender General Gait Details: cues for technique with RW; demo good ability to maintain NWB status on Lt LE; shuffled steps on Rt; c/o pain; limited mobility; will need to use w/c for incr distance    Stairs Stairs: Yes Stairs assistance: Min assist Stair Management: No rails;Step to pattern;Backwards;With walker Number of Stairs: 2 General stair comments: reviewed technique of ambulating up steps; pt not comfortable and unsteady; given handout and reviewed technique with wheelchair to enter house   Wheelchair Mobility Wheelchair Mobility Wheelchair mobility:  (verbally reviewed w/c technique to bump up steps )  Modified Rankin (Stroke Patients Only)       Balance Overall balance assessment: Needs assistance;History of Falls Sitting-balance support: Feet supported;No upper extremity supported Sitting balance-Leahy Scale: Good Sitting balance - Comments: sat EOB to donn pants    Standing balance support: During functional activity;Bilateral upper extremity supported Standing balance-Leahy Scale: Poor Standing balance comment: relying on RW for balance due to NWB status                     Cognition Arousal/Alertness: Awake/alert Behavior During Therapy: WFL for tasks assessed/performed Overall Cognitive Status: Within Functional Limits for tasks assessed                      Exercises      General Comments General comments (skin integrity, edema, etc.): reviewed ROM restrictions with pt for home care       Pertinent Vitals/Pain  Pain Assessment: 0-10 Pain Score: 7  Pain Location: Lt LE  Pain Descriptors / Indicators: Throbbing;Shooting Pain Intervention(s): Monitored during session;Premedicated before session;Repositioned    Home Living                      Prior Function             PT Goals (current goals can now be found in the care plan section) Acute Rehab PT Goals Patient Stated Goal: to go home today PT Goal Formulation: With patient Time For Goal Achievement: 06/14/14 Potential to Achieve Goals: Good Progress towards PT goals: Progressing toward goals    Frequency  Min 4X/week    PT Plan Current plan remains appropriate    Co-evaluation             End of Session Equipment Utilized During Treatment: Left knee immobilizer Activity Tolerance: Patient limited by pain;Patient limited by fatigue Patient left: in chair;with call bell/phone within reach     Time: 0955-1019 PT Time Calculation (min): 24 min  Charges:  $Gait Training: 23-37 mins                    G Codes:  Functional Assessment Tool Used: clinical judgement Functional Limitation: Mobility: Walking and moving around Mobility: Walking and Moving Around Current Status [phone](G8978): At least 1 percent but less than 20 percent impaired, limited or restricted Mobility: Walking and Moving Around Goal Status [phone](G8979): 0 percent impaired, limited or restricted Mobility: Walking and Moving Around Discharge Status [phone](G8980): At least 1 percent but less than 20 percent impaired, limited or restricted   Donell SievertWest, Amitai Delaughter N, [place]PT  [phone] 06/12/2014, 12:06 PM

## 2014-06-12 NOTE — Progress Notes (Signed)
    Subjective:  Patient reports pain as mild  Objective:   VITALS:   Filed Vitals:   06/11/14 1300 06/11/14 1833 06/11/14 2035 06/12/14 0453  BP: 151/103  151/104 128/91  Pulse: 96  106 100  Temp: 99.6 F (37.6 C) 100.5 F (38.1 C) 100.5 F (38.1 C) 98.7 F (37.1 C)  TempSrc:  Oral Oral   Resp: 18   18  Height:      Weight:      SpO2: 100%  99% 100%    Physical Exam  Dressing: C/D/I  Compartments soft  SILT DP/SP/S/S/T, 2+DP, +TA/GS/EHL  LABS  No results found for this or any previous visit (from the past 24 hour(s)).   Assessment/Plan: 2 Days Post-Op   Principal Problem:   Closed fracture of lateral portion of left tibial plateau with routine healing Active Problems:   Fracture, tibial plateau   PLAN: Weight Bearing: NWB LLE Dressings: Maintain C/D/I VTE prophylaxis: Lovenox 40 Qday, SCD's, mobilization Dispo: D/c home when clears PT   Margarita RanaMURPHY, Alasha Mcguinness, D 06/12/2014, 9:31 AM   Margarita Ranaimothy Sian Rockers, MD Cell [phone]

## 2014-06-12 NOTE — Discharge Summary (Signed)
Pt ready for discharge. Discharge instructions given. Meds returned from pharmacy. Pt left floor via family.

## 2014-06-15 ENCOUNTER — Other Ambulatory Visit: Payer: Self-pay | Admitting: Infectious Diseases

## 2014-06-30 ENCOUNTER — Other Ambulatory Visit: Payer: Self-pay | Admitting: Orthopedic Surgery

## 2014-06-30 DIAGNOSIS — R911 Solitary pulmonary nodule: Secondary | ICD-10-CM

## 2014-07-07 ENCOUNTER — Other Ambulatory Visit: Payer: 59

## 2014-07-13 ENCOUNTER — Other Ambulatory Visit: Payer: Self-pay | Admitting: Infectious Diseases

## 2014-07-14 ENCOUNTER — Other Ambulatory Visit: Payer: 59

## 2014-07-15 ENCOUNTER — Other Ambulatory Visit: Payer: 59

## 2014-08-25 ENCOUNTER — Telehealth: Payer: Self-pay | Admitting: *Deleted

## 2014-08-25 NOTE — Telephone Encounter (Signed)
Pt needs a walk-in appt w/ Dr. Ninetta LightsHatcher.  Has had multiple "no show" appts.  Left information about when he could "walk-in" to see Dr. Ninetta LightsHatcher.

## 2014-11-08 ENCOUNTER — Other Ambulatory Visit: Payer: Self-pay | Admitting: Infectious Diseases

## 2014-12-07 ENCOUNTER — Other Ambulatory Visit: Payer: Self-pay | Admitting: Infectious Diseases

## 2014-12-07 ENCOUNTER — Telehealth: Payer: Self-pay | Admitting: *Deleted

## 2014-12-07 NOTE — Telephone Encounter (Signed)
Patient last seen 03/2013, last labs 12/2013.  Patient has been notified multiple times regarding need for an office visit. Requested Tyrone Nineatripla was denied.  Left message notifying patient.  Informed him of our walk in clinics, stated he must see a physician before this can be refilled.  Andree CossHowell, Lakya Schrupp M, RN

## 2015-01-27 ENCOUNTER — Telehealth: Payer: Self-pay | Admitting: *Deleted

## 2015-01-27 ENCOUNTER — Other Ambulatory Visit: Payer: Self-pay | Admitting: Licensed Clinical Social Worker

## 2015-01-27 MED ORDER — EFAVIRENZ-EMTRICITAB-TENOFOVIR 600-200-300 MG PO TABS: 1.0000 | ORAL_TABLET | Freq: Every day | ORAL | Status: DC

## 2015-01-27 NOTE — Telephone Encounter (Signed)
Patient left message in triage stating he doesn't have money for a copay to see the doctor, but that he is running out of medication.  He is asking for refills. RN returned the call, it went to voicemail.  RN has left messages in the past letting the patient know he needs labs drawn and an office visit.  Last labs were May 2015, last office visit was August 2014.  RN stated that the patient needed to come in Monday 6/27 for labs before refills will be authorized, as it has been 1 year.  Lab appointments do not have a copay.  He can discuss follow up with Dr. Ninetta Lights after his labs are drawn.   Andree Coss, RN

## 2015-02-01 ENCOUNTER — Other Ambulatory Visit: Payer: 59

## 2015-02-01 LAB — CBC WITH DIFFERENTIAL/PLATELET
Basophils Absolute: 0 10*3/uL (ref 0.0–0.1)
Basophils Relative: 0 % (ref 0–1)
Eosinophils Absolute: 0.1 10*3/uL (ref 0.0–0.7)
Eosinophils Relative: 3 % (ref 0–5)
HCT: 42.2 % (ref 39.0–52.0)
HEMOGLOBIN: 14 g/dL (ref 13.0–17.0)
LYMPHS PCT: 31 % (ref 12–46)
Lymphs Abs: 1.2 10*3/uL (ref 0.7–4.0)
MCH: 30.6 pg (ref 26.0–34.0)
MCHC: 33.2 g/dL (ref 30.0–36.0)
MCV: 92.3 fL (ref 78.0–100.0)
MPV: 9 fL (ref 8.6–12.4)
Monocytes Absolute: 0.4 10*3/uL (ref 0.1–1.0)
Monocytes Relative: 11 % (ref 3–12)
NEUTROS ABS: 2.1 10*3/uL (ref 1.7–7.7)
Neutrophils Relative %: 55 % (ref 43–77)
Platelets: 309 10*3/uL (ref 150–400)
RBC: 4.57 MIL/uL (ref 4.22–5.81)
RDW: 15.7 % — ABNORMAL HIGH (ref 11.5–15.5)
WBC: 3.8 10*3/uL — ABNORMAL LOW (ref 4.0–10.5)

## 2015-02-01 LAB — COMPREHENSIVE METABOLIC PANEL
ALBUMIN: 3.6 g/dL (ref 3.5–5.2)
ALT: 11 U/L (ref 0–53)
AST: 15 U/L (ref 0–37)
Alkaline Phosphatase: 90 U/L (ref 39–117)
BILIRUBIN TOTAL: 0.3 mg/dL (ref 0.2–1.2)
BUN: 9 mg/dL (ref 6–23)
CHLORIDE: 108 meq/L (ref 96–112)
CO2: 22 meq/L (ref 19–32)
CREATININE: 0.68 mg/dL (ref 0.50–1.35)
Calcium: 8.1 mg/dL — ABNORMAL LOW (ref 8.4–10.5)
GLUCOSE: 78 mg/dL (ref 70–99)
Potassium: 4.5 mEq/L (ref 3.5–5.3)
SODIUM: 140 meq/L (ref 135–145)
TOTAL PROTEIN: 6.9 g/dL (ref 6.0–8.3)

## 2015-02-01 LAB — LIPID PANEL
Cholesterol: 143 mg/dL (ref 0–200)
HDL: 61 mg/dL (ref >=40)
LDL CALC: 60 mg/dL (ref 0–99)
TRIGLYCERIDES: 108 mg/dL (ref <150)
Total CHOL/HDL Ratio: 2.3 Ratio
VLDL: 22 mg/dL (ref 0–40)

## 2015-02-02 LAB — URINE CYTOLOGY ANCILLARY ONLY
CHLAMYDIA, DNA PROBE: NEGATIVE
NEISSERIA GONORRHEA: NEGATIVE

## 2015-02-02 LAB — T-HELPER CELL (CD4) - (RCID CLINIC ONLY)
CD4 T CELL ABS: 370 /uL — AB (ref 400–2700)
CD4 T CELL HELPER: 32 % — AB (ref 33–55)

## 2015-02-02 LAB — HIV-1 RNA QUANT-NO REFLEX-BLD
HIV 1 RNA Quant: <20 copies/mL (ref <20)
HIV-1 RNA Quant, Log: <1.3 {Log} (ref <1.30)

## 2015-02-02 LAB — RPR

## 2015-02-15 ENCOUNTER — Other Ambulatory Visit: Payer: Self-pay | Admitting: Infectious Diseases

## 2015-02-25 ENCOUNTER — Ambulatory Visit: Payer: 59 | Admitting: Infectious Diseases

## 2015-03-17 ENCOUNTER — Telehealth: Payer: Self-pay | Admitting: *Deleted

## 2015-03-17 ENCOUNTER — Other Ambulatory Visit: Payer: Self-pay | Admitting: Infectious Diseases

## 2015-03-17 DIAGNOSIS — B20 Human immunodeficiency virus [HIV] disease: Secondary | ICD-10-CM

## 2015-03-17 NOTE — Telephone Encounter (Signed)
Patient came in for lab work on 6/28 after being informed that his Atripla refill was denied.  He no-showed an appointment with Dr. Ninetta Lights after that.  His last saw a physician 2 years ago (August 2014).  Patient again requesting refill of Atripla.  Please advise if this is ok to fill.  RN left another message advising patient to call for an appointment, instructions regarding walk-ins. Andree Coss, RN

## 2015-03-17 NOTE — Telephone Encounter (Signed)
I note that his viral load was undetectable in June when he came in for lab work so it appears he is taking his medication correctly and consistently. Please give him 2 refills of Atripla and leave a message that he must schedule a visit with Dr. Ninetta Lights within that timeframe.

## 2015-03-25 NOTE — Telephone Encounter (Signed)
Thanks John.

## 2015-03-28 NOTE — Telephone Encounter (Signed)
Left another message notifying patient of the refill sent to his pharmacy and requesting the patient schedule an appointment for future refills.

## 2015-06-15 ENCOUNTER — Other Ambulatory Visit: Payer: Self-pay | Admitting: Internal Medicine

## 2015-06-15 NOTE — Telephone Encounter (Signed)
Requested pt call [facility] re: appt

## 2015-07-13 ENCOUNTER — Other Ambulatory Visit: Payer: Self-pay | Admitting: Internal Medicine

## 2015-07-13 ENCOUNTER — Encounter: Payer: Self-pay | Admitting: *Deleted

## 2015-07-13 NOTE — Telephone Encounter (Signed)
Sent the patient a message notifying him of the denied medication refill. Patient last seen in office 2014.  Last labs were 6/16, but patient was directed by Dr. Wess Bottsampell that he would get 2 months of medications with the expectation he would make lab/md appointments.  Patient did not.  Medication denied. Patient directed to call the office. Andree CossHowell, Michelle M, RN

## 2015-07-28 ENCOUNTER — Telehealth: Payer: Self-pay | Admitting: *Deleted

## 2015-07-28 NOTE — Telephone Encounter (Signed)
Received notice patient did not read the MyChart message sent regarding his denied refill.  Contacted patient and instructed him that we are unable to fill prescriptions until he contacts the office and is seen. Andree CossHowell, Maybell Misenheimer M, RN

## 2015-08-10 ENCOUNTER — Ambulatory Visit (INDEPENDENT_AMBULATORY_CARE_PROVIDER_SITE_OTHER): Payer: 59 | Admitting: *Deleted

## 2015-08-10 ENCOUNTER — Other Ambulatory Visit: Payer: 59

## 2015-08-10 DIAGNOSIS — B20 Human immunodeficiency virus [HIV] disease: Secondary | ICD-10-CM

## 2015-08-10 DIAGNOSIS — Z23 Encounter for immunization: Secondary | ICD-10-CM

## 2015-08-10 LAB — CBC WITH DIFFERENTIAL/PLATELET
Basophils Absolute: 0 10*3/uL (ref 0.0–0.1)
Basophils Relative: 1 % (ref 0–1)
Eosinophils Absolute: 0.1 10*3/uL (ref 0.0–0.7)
Eosinophils Relative: 2 % (ref 0–5)
HCT: 47 % (ref 39.0–52.0)
HEMOGLOBIN: 15.9 g/dL (ref 13.0–17.0)
Lymphocytes Relative: 28 % (ref 12–46)
Lymphs Abs: 1.1 10*3/uL (ref 0.7–4.0)
MCH: 31.6 pg (ref 26.0–34.0)
MCHC: 33.8 g/dL (ref 30.0–36.0)
MCV: 93.4 fL (ref 78.0–100.0)
MONOS PCT: 15 % — AB (ref 3–12)
MPV: 9.2 fL (ref 8.6–12.4)
Monocytes Absolute: 0.6 10*3/uL (ref 0.1–1.0)
NEUTROS ABS: 2.1 10*3/uL (ref 1.7–7.7)
Neutrophils Relative %: 54 % (ref 43–77)
Platelets: 301 10*3/uL (ref 150–400)
RBC: 5.03 MIL/uL (ref 4.22–5.81)
RDW: 14.3 % (ref 11.5–15.5)
WBC: 3.9 10*3/uL — ABNORMAL LOW (ref 4.0–10.5)

## 2015-08-10 LAB — COMPLETE METABOLIC PANEL WITH GFR
ALBUMIN: 4 g/dL (ref 3.6–5.1)
ALT: 19 U/L (ref 9–46)
AST: 23 U/L (ref 10–40)
Alkaline Phosphatase: 104 U/L (ref 40–115)
BILIRUBIN TOTAL: 0.4 mg/dL (ref 0.2–1.2)
BUN: 8 mg/dL (ref 7–25)
CO2: 25 mmol/L (ref 20–31)
Calcium: 8.9 mg/dL (ref 8.6–10.3)
Chloride: 106 mmol/L (ref 98–110)
Creat: 0.84 mg/dL (ref 0.60–1.35)
GFR, Est African American: >89 mL/min (ref >=60)
GFR, Est Non African American: >89 mL/min (ref >=60)
GLUCOSE: 97 mg/dL (ref 65–99)
POTASSIUM: 4.5 mmol/L (ref 3.5–5.3)
SODIUM: 140 mmol/L (ref 135–146)
TOTAL PROTEIN: 7.7 g/dL (ref 6.1–8.1)

## 2015-08-11 LAB — T-HELPER CELL (CD4) - (RCID CLINIC ONLY)
CD4 % Helper T Cell: 34 % (ref 33–55)
CD4 T Cell Abs: 350 /uL — ABNORMAL LOW (ref 400–2700)

## 2015-08-12 ENCOUNTER — Other Ambulatory Visit: Payer: Self-pay | Admitting: Internal Medicine

## 2015-08-12 LAB — HIV-1 RNA QUANT-NO REFLEX-BLD: HIV 1 RNA Quant: <20 copies/mL (ref <20)

## 2015-08-22 ENCOUNTER — Ambulatory Visit (INDEPENDENT_AMBULATORY_CARE_PROVIDER_SITE_OTHER): Payer: 59 | Admitting: Infectious Diseases

## 2015-08-22 ENCOUNTER — Encounter: Payer: Self-pay | Admitting: Infectious Diseases

## 2015-08-22 VITALS — BP 179/117 | HR 98 | Temp 98.1°F | Ht 74.0 in | Wt 177.0 lb

## 2015-08-22 DIAGNOSIS — B20 Human immunodeficiency virus [HIV] disease: Secondary | ICD-10-CM | POA: Diagnosis not present

## 2015-08-22 DIAGNOSIS — Z113 Encounter for screening for infections with a predominantly sexual mode of transmission: Secondary | ICD-10-CM

## 2015-08-22 DIAGNOSIS — M2550 Pain in unspecified joint: Secondary | ICD-10-CM

## 2015-08-22 DIAGNOSIS — M1990 Unspecified osteoarthritis, unspecified site: Secondary | ICD-10-CM | POA: Insufficient documentation

## 2015-08-22 DIAGNOSIS — I10 Essential (primary) hypertension: Secondary | ICD-10-CM | POA: Diagnosis not present

## 2015-08-22 DIAGNOSIS — Z79899 Other long term (current) drug therapy: Secondary | ICD-10-CM

## 2015-08-22 DIAGNOSIS — F17200 Nicotine dependence, unspecified, uncomplicated: Secondary | ICD-10-CM

## 2015-08-22 MED ORDER — ELVITEG-COBIC-EMTRICIT-TENOFAF 150-150-200-10 MG PO TABS: 1.0000 | ORAL_TABLET | Freq: Every day | ORAL | Status: DC

## 2015-08-22 MED ORDER — ATENOLOL 50 MG PO TABS: 25.0000 mg | ORAL_TABLET | Freq: Every day | ORAL | Status: DC

## 2015-08-22 NOTE — Assessment & Plan Note (Signed)
He is in contemplative stage.  Encouraged to quit.

## 2015-08-22 NOTE — Assessment & Plan Note (Signed)
L wristr slightly swollen. Limited ROM.  He is taking NSAIDs. Will send him for plain film.  Rheum eval?

## 2015-08-22 NOTE — Addendum Note (Signed)
Addended by: HATCHER, JEFFREY C on: 08/22/2015 04:49 PM   Modules accepted: Orders

## 2015-08-22 NOTE — Assessment & Plan Note (Signed)
Very hypertensive today.  Will restart his atenolol, 25mg  daily.

## 2015-08-22 NOTE — Assessment & Plan Note (Signed)
He is doing well.  Offered/refused condoms.  Gets flu and pnvx today Will change his art to genvoya.  rtc in 4-5 months

## 2015-08-22 NOTE — Progress Notes (Signed)
   Subjective:    Patient ID: Gregory Whitney, male    DOB: July 30, 1976, 40 y.o.   MRN: [id number]  HPI 40 yo M with newly Dx AIDS 08-2009. CD4 was 90 on 10-2009. On 11-16-09 was started on atripla. He took his last dose yesterday.  Has been busy working.   BP rx ran out as well. States that it has been high. Rare SOB or headache.   HIV 1 RNA QUANT (copies/mL)  Date Value  08/10/2015 <20  02/01/2015 <20  12/22/2013 <20   CD4 T CELL ABS (/uL)  Date Value  08/10/2015 350*  02/01/2015 370*  12/22/2013 470      Review of Systems  Constitutional: Negative for appetite change and unexpected weight change.  Respiratory: Negative for shortness of breath.   Cardiovascular: Negative for chest pain.  Gastrointestinal: Negative for nausea and diarrhea.  Genitourinary: Negative for difficulty urinating.  Neurological: Negative for headaches.       Objective:   Physical Exam  Constitutional: He appears well-developed and well-nourished.  HENT:  Mouth/Throat: No oropharyngeal exudate.  Eyes: EOM are normal. Pupils are equal, round, and reactive to light.  Neck: Neck supple.  Cardiovascular: Normal rate, regular rhythm and normal heart sounds.   Pulmonary/Chest: Effort normal and breath sounds normal.  Abdominal: Soft. Bowel sounds are normal. There is no tenderness. There is no rebound.  Musculoskeletal: He exhibits no edema.  Lymphadenopathy:    He has no cervical adenopathy.      Assessment & Plan:

## 2015-11-19 IMAGING — CT CT TIBIA FIBULA *L* W/O CM
4 of 5 series · 16 of 33 positions shown, 19 images · non-contrast
Comparison: 05/21/2014.

CLINICAL DATA: Fall. Tibia and fibula fracture. Bicondylar tibial
plateau fracture. Subsequent encounter.

EXAM:
CT TIBIA FIBULA LEFT WITHOUT CONTRAST
TECHNIQUE: Multidetector CT imaging was performed according to the standard
protocol. Multiplanar CT image reconstructions were also generated.

[Series 3: tib/fib bone · axial · 0.33mm/px · z∈[-524,-194]mm · 5 of 200 slices shown, 7 images]
[im 34/200  soft-tissue]
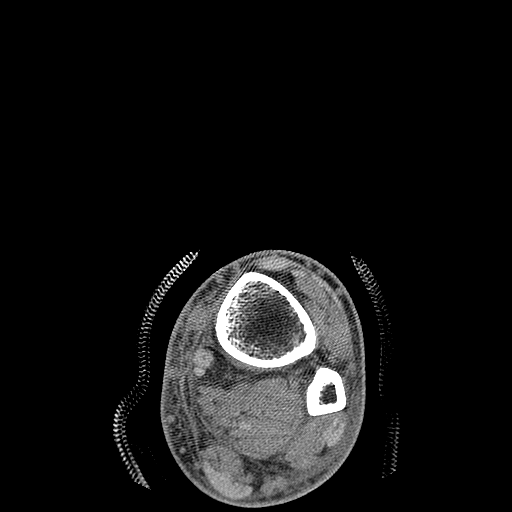
[im 34/200  bone]
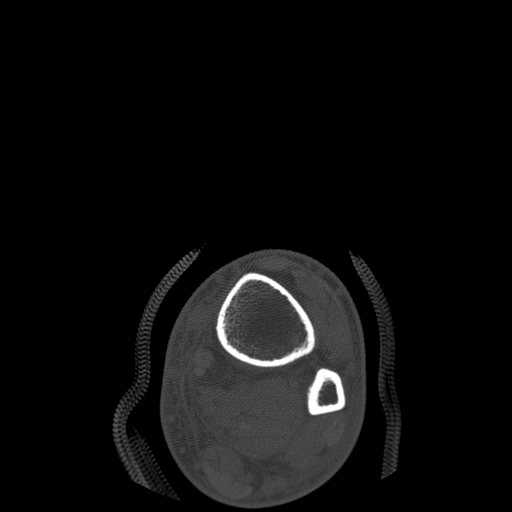
[im 67/200  bone]
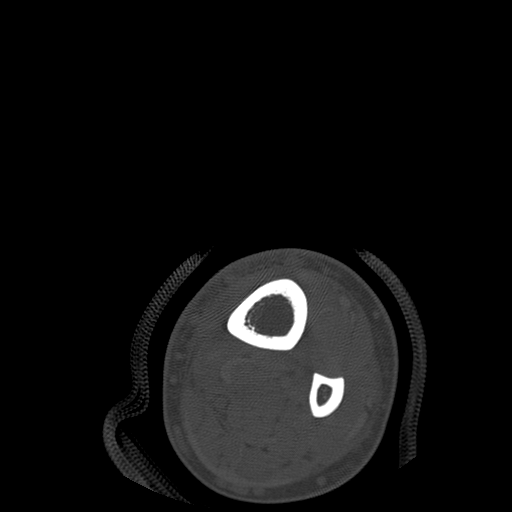
[im 100/200  bone]
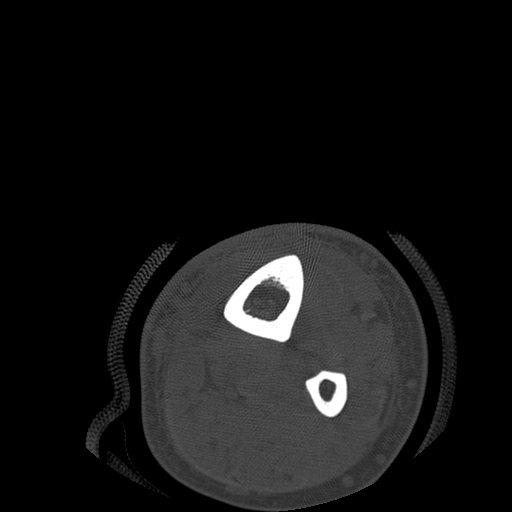
[im 133/200  bone]
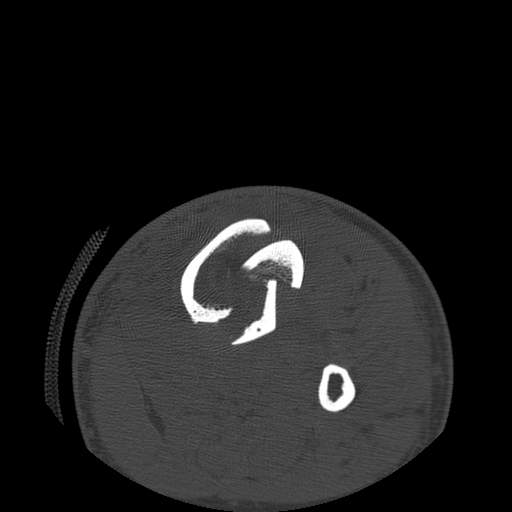
[im 166/200  soft-tissue]
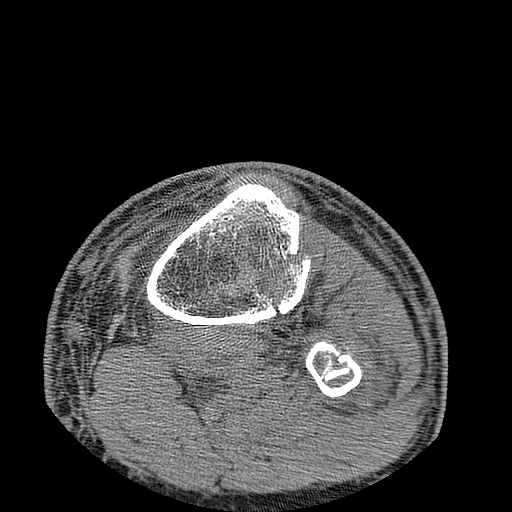
[im 166/200  bone]
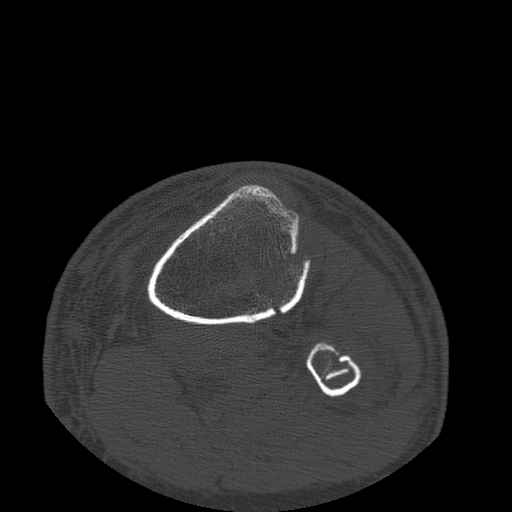

[Series 4: tib/fib soft · axial · 0.33mm/px · z∈[-524,-194]mm · 5 of 200 slices shown]
[im 34/200  soft-tissue]
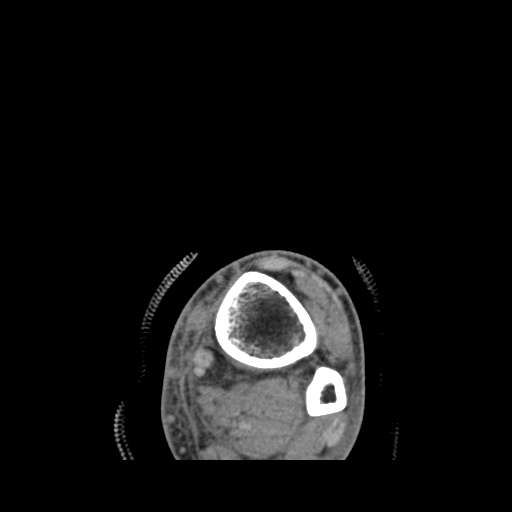
[im 67/200  soft-tissue]
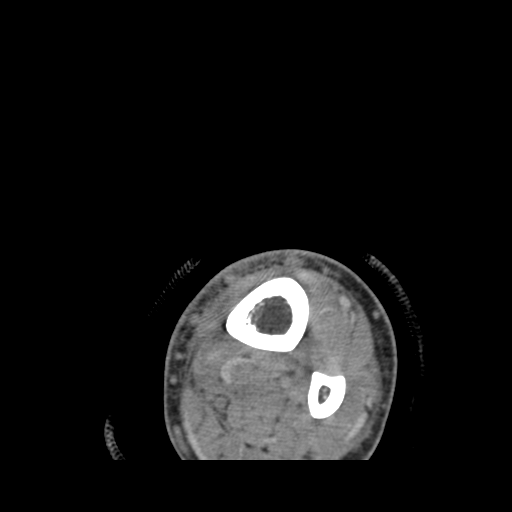
[im 100/200  soft-tissue]
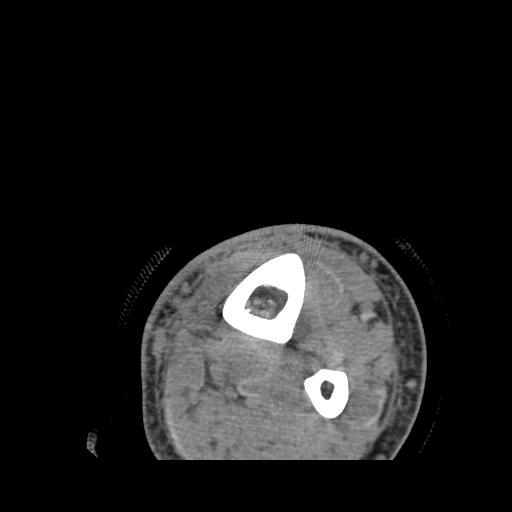
[im 133/200  soft-tissue]
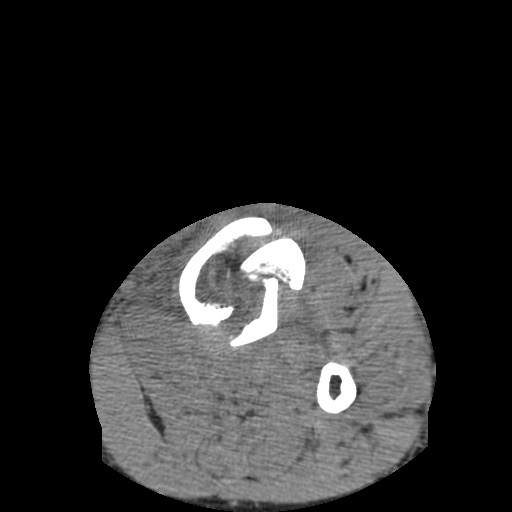
[im 166/200  soft-tissue]
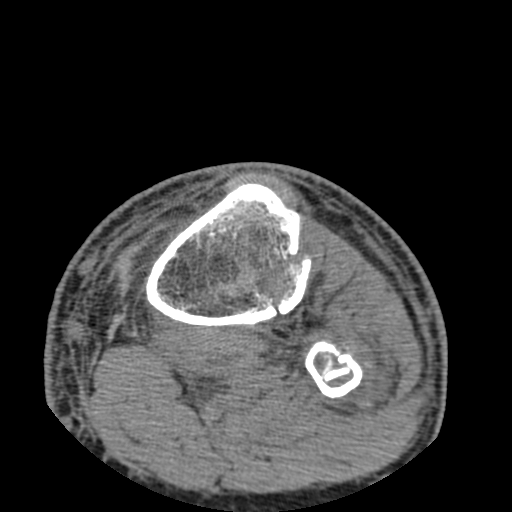

[Series 105: sag soft · sagittal · 1.00mm/px · 5 of 64 slices shown, 6 images]
[im 22/64  bone]
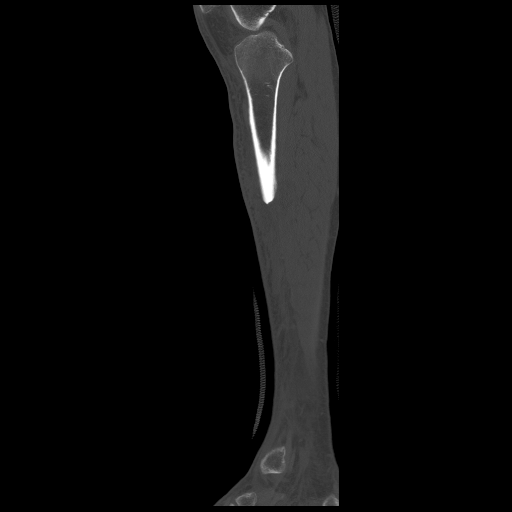
[im 27/64  bone]
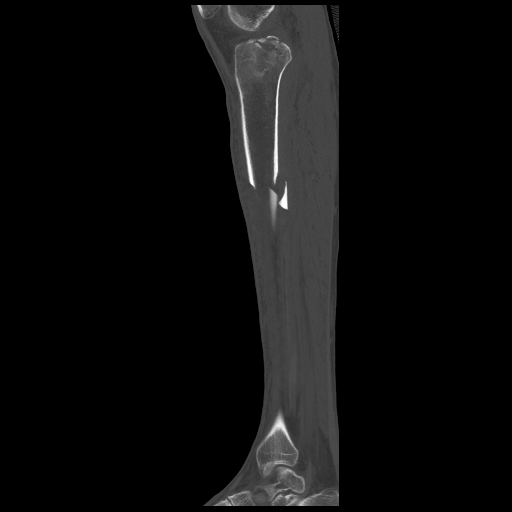
[im 32/64  soft-tissue]
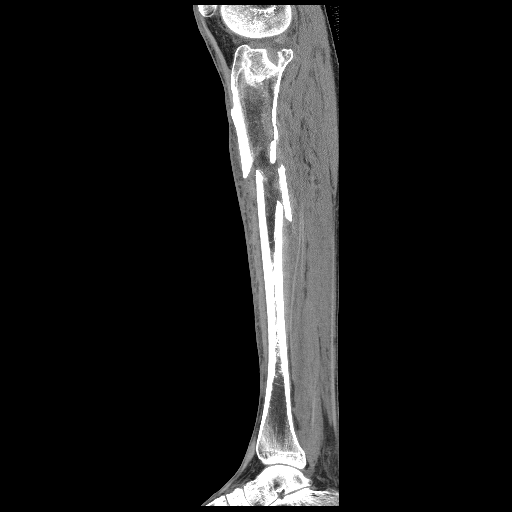
[im 32/64  bone]
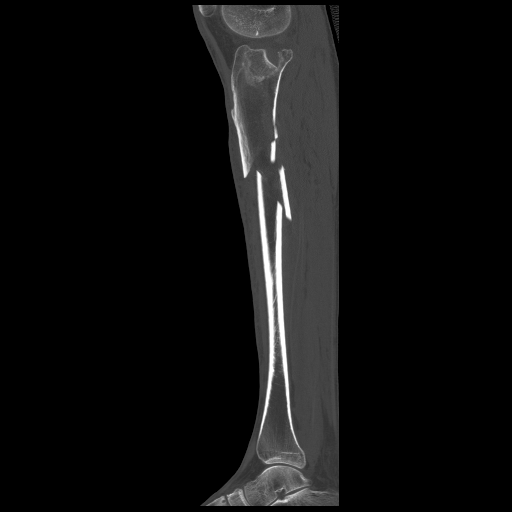
[im 37/64  bone]
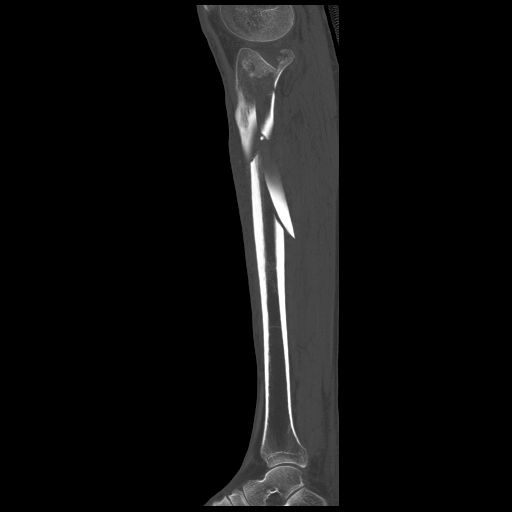
[im 43/64  bone]
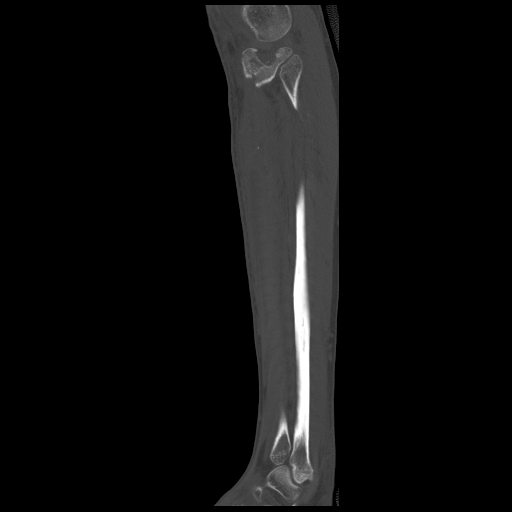

[Series 500: cor bone · coronal · 1.00mm/px · 1 of 72 slices shown]
[im 36/72  bone]
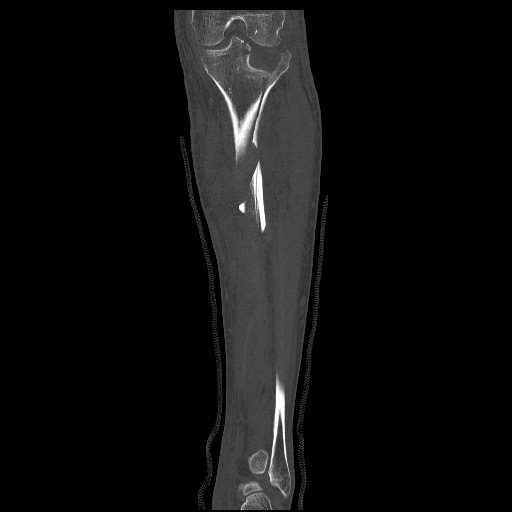

[16 of 33 positions shown; findings below may reference images not displayed]

FINDINGS: There is a depressed lateral tibial plateau fracture. Maximal
depression of the weight-bearing surface of the tibial plateau
measures 2 cm. Oblique fracture plane extends into the metaphysis.
In the proximal diaphysis, there is an oblique fracture of the
tibial shaft. There is [DATE] shaft width lateral displacement.
Displacement measures 12 mm. Posterior displacement is also about
[DATE] shaft width, measuring 12 mm as well. The tibial diaphyseal
fracture is comminuted with posterior displacement and angulation of
cortical fragments. The oblique fracture extending through the
metaphysis to the lateral tibial plateau fracture is nondisplaced.
The proximal tibiofibular joint appears intact.

Proximal fibular diaphysis fracture is present, with a cortical
shard in the medullary space of the metaphysis and diaphysis. There
is mild dorsal angulation of the proximal fibular fracture. Lateral
displacement of the shaft is approximately [DATE] shaft width,
measuring 9 mm. The distal tibia and fibula are within normal
limits. Hemarthrosis of the knee is partially visible. Diffuse edema
of the leg associated with the fracture. Ankle ligaments and tendons
appear within normal limits.
IMPRESSION: 1. 2 cm depression of lateral tibial plateau fracture, with oblique
fracture extension into the tibial meta diaphysis, compatible with
Schatzker VI fracture.
2. Mildly comminuted displaced proximal fibular diaphysis fracture.

## 2015-12-21 ENCOUNTER — Ambulatory Visit: Payer: 59 | Admitting: Infectious Diseases

## 2016-07-12 ENCOUNTER — Other Ambulatory Visit: Payer: Self-pay | Admitting: Infectious Diseases

## 2016-07-12 ENCOUNTER — Encounter: Payer: Self-pay | Admitting: *Deleted

## 2016-07-12 DIAGNOSIS — B20 Human immunodeficiency virus [HIV] disease: Secondary | ICD-10-CM

## 2016-08-08 ENCOUNTER — Other Ambulatory Visit: Payer: 59

## 2016-08-14 ENCOUNTER — Other Ambulatory Visit: Payer: 59

## 2016-08-22 ENCOUNTER — Ambulatory Visit: Payer: 59 | Admitting: Infectious Diseases

## 2016-08-31 ENCOUNTER — Other Ambulatory Visit: Payer: Self-pay | Admitting: Infectious Diseases

## 2016-08-31 DIAGNOSIS — B20 Human immunodeficiency virus [HIV] disease: Secondary | ICD-10-CM

## 2016-09-03 ENCOUNTER — Ambulatory Visit: Payer: 59 | Admitting: Infectious Diseases

## 2016-10-19 ENCOUNTER — Other Ambulatory Visit: Payer: Self-pay | Admitting: Infectious Diseases

## 2016-10-19 DIAGNOSIS — B20 Human immunodeficiency virus [HIV] disease: Secondary | ICD-10-CM

## 2017-05-06 ENCOUNTER — Ambulatory Visit: Payer: 59

## 2017-05-28 ENCOUNTER — Other Ambulatory Visit: Payer: 59

## 2017-05-30 ENCOUNTER — Telehealth: Payer: Self-pay | Admitting: *Deleted

## 2017-05-30 NOTE — Telephone Encounter (Signed)
Patient's appointment changed from 11/6 to 11/12 per provider. Patient is asking for a refill of Genvoya, stating he has been off for almost 1 month. EPIC shows his last refill was 08/2016, last office visit was 08/2015 with multiple no shows. Patient states he has been getting his medication monthly from his [facility].  Per pharmacy, 10/01/16 was last fill for 30 days.   RN stated I was unable to refill this, as he has not been seen in almost 2 years, apologized for the 1 week delay in appointment. RN encouraged him to keep the appointment with Dr Ninetta LightsHatcher so he can restart therapy. Andree CossHowell, Paisyn Guercio M, RN

## 2017-06-11 ENCOUNTER — Ambulatory Visit: Payer: 59 | Admitting: Infectious Diseases

## 2017-06-17 ENCOUNTER — Encounter: Payer: Self-pay | Admitting: Infectious Diseases

## 2017-06-17 ENCOUNTER — Ambulatory Visit: Payer: 59 | Admitting: Infectious Diseases

## 2017-06-17 NOTE — Progress Notes (Deleted)
   Subjective:    Patient ID: Gregory Whitney, male    DOB: 09-24-1975, 41 y.o.   MRN: [id number]  HPI    Review of Systems     Objective:   Physical Exam        Assessment & Plan:

## 2018-02-13 ENCOUNTER — Ambulatory Visit: Payer: 59

## 2018-02-20 ENCOUNTER — Ambulatory Visit: Payer: Self-pay

## 2018-04-24 ENCOUNTER — Ambulatory Visit: Payer: Self-pay

## 2019-01-07 ENCOUNTER — Ambulatory Visit: Payer: Self-pay | Admitting: Pharmacist

## 2019-01-13 ENCOUNTER — Telehealth: Payer: Self-pay | Admitting: Pharmacy Technician

## 2019-01-13 ENCOUNTER — Other Ambulatory Visit: Payer: Self-pay

## 2019-01-13 ENCOUNTER — Ambulatory Visit (INDEPENDENT_AMBULATORY_CARE_PROVIDER_SITE_OTHER): Payer: Commercial Managed Care - PPO | Admitting: Pharmacist

## 2019-01-13 ENCOUNTER — Other Ambulatory Visit (HOSPITAL_COMMUNITY)
Admission: RE | Admit: 2019-01-13 | Discharge: 2019-01-13 | Disposition: A | Discharge status: Home / Self Care | Payer: Commercial Managed Care - PPO | Source: Ambulatory Visit | Attending: Infectious Diseases | Admitting: Infectious Diseases

## 2019-01-13 DIAGNOSIS — B20 Human immunodeficiency virus [HIV] disease: Secondary | ICD-10-CM

## 2019-01-13 MED ORDER — BICTEGRAVIR-EMTRICITAB-TENOFOV 50-200-25 MG PO TABS: 1.0000 | ORAL_TABLET | Freq: Every day | ORAL | 5 refills | Status: DC

## 2019-01-13 NOTE — Telephone Encounter (Addendum)
[facility] Patient Advocate Encounter    Findings of the benefits investigation conducted this morning via test claims for the patient's upcoming appointment on 01/13/2019 are as follows:   Insurance: Secretary/administrator through [employer] (active) Prior Authorixation: needed for specialty medications Key: [id number] (pending status) sent 01/13/2019 Determination made within 1-5 days [phone] (phone)  [facility] Patient Advocate will follow up once patient arrives for their appointment to see if there is an update to his previous status.   Bartholomew Crews, CPhT Specialty Pharmacy Patient [facility] for Infectious Disease Phone: [phone] Fax: [phone] 01/13/2019 8:31 AM
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
## 2019-01-14 ENCOUNTER — Other Ambulatory Visit: Payer: Self-pay | Admitting: Pharmacist

## 2019-01-14 DIAGNOSIS — B20 Human immunodeficiency virus [HIV] disease: Secondary | ICD-10-CM

## 2019-01-14 LAB — T-HELPER CELL (CD4) - (RCID CLINIC ONLY)
CD4 % Helper T Cell: 24 % — ABNORMAL LOW (ref 33–65)
CD4 T Cell Abs: 278 /uL — ABNORMAL LOW (ref 400–1790)

## 2019-01-14 MED ORDER — BICTEGRAVIR-EMTRICITAB-TENOFOV 50-200-25 MG PO TABS: 1.0000 | ORAL_TABLET | Freq: Every day | ORAL | 5 refills | Status: DC

## 2019-01-14 NOTE — Telephone Encounter (Signed)
[facility] Patient Advocate Encounter  Prior Authorization for [facility]  has been approved.    PA# [id number] Effective dates: 01/13/2019 through 01/13/2020  It is required per the insurance company for the medication to be filled at [facility]. Lou Miner, PharmD) [address] [place] [address] [phone]  Prescription was sent to their pharmacy and successfully received on 01/14/2019.   Patient's co-pay is $1290.40. The specialty pharmacy is getting a [place] card for the patient to make the cost $0.00   [facility] will continue to follow if the patient needs any further assistance.  Bartholomew Crews, CPhT Specialty Pharmacy Patient [facility] for Infectious Disease Phone: [phone] Fax: [phone] 01/14/2019 2:35 PM

## 2019-01-15 LAB — URINE CYTOLOGY ANCILLARY ONLY
Chlamydia: NEGATIVE
Neisseria Gonorrhea: NEGATIVE

## 2019-01-25 LAB — CBC
HCT: 43.1 % (ref 38.5–50.0)
Hemoglobin: 14 g/dL (ref 13.2–17.1)
MCH: 26 pg — ABNORMAL LOW (ref 27.0–33.0)
MCHC: 32.5 g/dL (ref 32.0–36.0)
MCV: 80 fL (ref 80.0–100.0)
MPV: 9.9 fL (ref 7.5–12.5)
Platelets: 282 10*3/uL (ref 140–400)
RBC: 5.39 10*6/uL (ref 4.20–5.80)
RDW: 15.6 % — ABNORMAL HIGH (ref 11.0–15.0)
WBC: 3.8 10*3/uL (ref 3.8–10.8)

## 2019-01-25 LAB — COMPREHENSIVE METABOLIC PANEL
AG Ratio: 1 (calc) (ref 1.0–2.5)
ALT: 13 U/L (ref 9–46)
AST: 17 U/L (ref 10–40)
Albumin: 3.5 g/dL — ABNORMAL LOW (ref 3.6–5.1)
Alkaline phosphatase (APISO): 65 U/L (ref 36–130)
BUN: 13 mg/dL (ref 7–25)
CO2: 23 mmol/L (ref 20–32)
Calcium: 8.5 mg/dL — ABNORMAL LOW (ref 8.6–10.3)
Chloride: 107 mmol/L (ref 98–110)
Creat: 0.73 mg/dL (ref 0.60–1.35)
Globulin: 3.6 g/dL (calc) (ref 1.9–3.7)
Glucose, Bld: 81 mg/dL (ref 65–99)
Potassium: 4.1 mmol/L (ref 3.5–5.3)
Sodium: 139 mmol/L (ref 135–146)
Total Bilirubin: 0.4 mg/dL (ref 0.2–1.2)
Total Protein: 7.1 g/dL (ref 6.1–8.1)

## 2019-01-25 LAB — HIV-1 INTEGRASE GENOTYPE

## 2019-01-25 LAB — HIV RNA, RTPCR W/R GT (RTI, PI,INT)
HIV 1 RNA Quant: 21100 copies/mL — ABNORMAL HIGH
HIV-1 RNA Quant, Log: 4.32 Log copies/mL — ABNORMAL HIGH

## 2019-01-25 LAB — LIPID PANEL
Cholesterol: 140 mg/dL (ref <200)
HDL: 50 mg/dL (ref >=40)
LDL Cholesterol (Calc): 75 mg/dL (calc)
Non-HDL Cholesterol (Calc): 90 mg/dL (calc) (ref <130)
Total CHOL/HDL Ratio: 2.8 (calc) (ref <5.0)
Triglycerides: 72 mg/dL (ref <150)

## 2019-01-25 LAB — RPR: RPR Ser Ql: NONREACTIVE

## 2019-01-25 LAB — HIV-1 GENOTYPE: HIV-1 Genotype: DETECTED — AB

## 2019-02-24 ENCOUNTER — Ambulatory Visit (INDEPENDENT_AMBULATORY_CARE_PROVIDER_SITE_OTHER): Payer: Commercial Managed Care - PPO | Admitting: Pharmacist

## 2019-02-24 ENCOUNTER — Other Ambulatory Visit: Payer: Self-pay

## 2019-02-24 DIAGNOSIS — B20 Human immunodeficiency virus [HIV] disease: Secondary | ICD-10-CM

## 2019-02-24 DIAGNOSIS — I10 Essential (primary) hypertension: Secondary | ICD-10-CM | POA: Diagnosis not present

## 2019-02-24 MED ORDER — ATENOLOL 50 MG PO TABS: 50.0000 mg | ORAL_TABLET | Freq: Every day | ORAL | 5 refills | Status: DC
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
## 2019-02-27 LAB — HIV-1 RNA QUANT-NO REFLEX-BLD
HIV 1 RNA Quant: 34 copies/mL — ABNORMAL HIGH
HIV-1 RNA Quant, Log: 1.53 Log copies/mL — ABNORMAL HIGH

## 2019-05-26 ENCOUNTER — Ambulatory Visit: Payer: Commercial Managed Care - PPO | Admitting: Infectious Diseases

## 2019-06-11 ENCOUNTER — Other Ambulatory Visit: Payer: Self-pay | Admitting: Pharmacist

## 2019-06-11 DIAGNOSIS — B20 Human immunodeficiency virus [HIV] disease: Secondary | ICD-10-CM

## 2019-08-18 ENCOUNTER — Telehealth: Payer: Self-pay | Admitting: *Deleted

## 2019-08-18 DIAGNOSIS — B20 Human immunodeficiency virus [HIV] disease: Secondary | ICD-10-CM

## 2019-08-18 MED ORDER — BIKTARVY 50-200-25 MG PO TABS: 1.0000 | ORAL_TABLET | Freq: Every day | ORAL | 0 refills | Status: DC

## 2019-08-18 NOTE — Telephone Encounter (Signed)
Patient called for refills of biktarvy, states he has 4 pills left, has only missed 1 dose since he saw Cassie.  He knows he must keep appointment 1/20 for additional refills. Will send 30 day supply. Andree Coss, RN

## 2019-08-19 ENCOUNTER — Other Ambulatory Visit: Payer: Self-pay | Admitting: *Deleted

## 2019-08-19 DIAGNOSIS — B20 Human immunodeficiency virus [HIV] disease: Secondary | ICD-10-CM

## 2019-08-19 MED ORDER — BIKTARVY 50-200-25 MG PO TABS: 1.0000 | ORAL_TABLET | Freq: Every day | ORAL | 0 refills | Status: DC

## 2019-08-26 ENCOUNTER — Other Ambulatory Visit: Payer: Self-pay

## 2019-08-26 ENCOUNTER — Ambulatory Visit: Payer: Commercial Managed Care - PPO | Admitting: Infectious Diseases

## 2019-08-26 ENCOUNTER — Encounter: Payer: Self-pay | Admitting: Infectious Diseases

## 2019-08-26 VITALS — BP 168/121 | HR 84 | Temp 97.9°F | Wt 199.0 lb

## 2019-08-26 DIAGNOSIS — Z23 Encounter for immunization: Secondary | ICD-10-CM

## 2019-08-26 DIAGNOSIS — B20 Human immunodeficiency virus [HIV] disease: Secondary | ICD-10-CM | POA: Diagnosis not present

## 2019-08-26 DIAGNOSIS — F17200 Nicotine dependence, unspecified, uncomplicated: Secondary | ICD-10-CM

## 2019-08-26 DIAGNOSIS — Z21 Asymptomatic human immunodeficiency virus [HIV] infection status: Secondary | ICD-10-CM

## 2019-08-26 DIAGNOSIS — I10 Essential (primary) hypertension: Secondary | ICD-10-CM

## 2019-08-26 MED ORDER — BIKTARVY 50-200-25 MG PO TABS: 1.0000 | ORAL_TABLET | Freq: Every day | ORAL | 4 refills | Status: DC

## 2019-08-26 NOTE — Progress Notes (Signed)
Subjective:    Patient ID: Gregory Whitney, male    DOB: 1976/01/19, 44 y.o.   MRN: [id number]  HPI:  Gregory Whitney is a 44 y.o. 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 CD4 nadir 90.    Previous Regimens:   Atripla   Cleopatra Cedar saw our pharmacist, Cassie in June 2020 to get back on his antiretrovirals after a lapse. He cited the lapse due to instability with social/work situations as well as affordability of copay at the time.  He was started on Biktarvy one a day and had a great response in viral load after 4 weeks of use from 21,000 > ~35 copies. He has tolerated it very well without any concern for side effect.   Smokes cigarettes daily and drinks two 40 oz of beer most days after work. Helps him relax. Does not feel it is problematic for him. CAGE screen negative.  He has noticed that he has had some trouble with sleep lately.   He is out of his blood pressure medication but was on the phone with the pharmacy shortly before our visit to arrange these to be picked up. He does not have a primary care team.    Review of Systems  Constitutional: Negative for appetite change, chills, fatigue, fever and unexpected weight change.  Eyes: Negative for visual disturbance.  Respiratory: Negative for cough and shortness of breath.   Cardiovascular: Negative for chest pain and leg swelling.  Gastrointestinal: Negative for abdominal pain, diarrhea and nausea.  Genitourinary: Negative for difficulty urinating, discharge, dysuria and genital sores.  Musculoskeletal: Negative for joint swelling.  Skin: Negative for color change and rash.  Neurological: Negative for dizziness and headaches.  Hematological: Negative for adenopathy.  Psychiatric/Behavioral: Positive for sleep disturbance. Negative for dysphoric mood. The patient is not nervous/anxious.        Objective:     Vitals:   08/26/19 1507  BP: (!) 168/121  Pulse: 84  Temp: 97.9 F (36.6 C)   Body mass index is 25.55 kg/m.    Physical Exam Vitals reviewed.  HENT:     Mouth/Throat:     Mouth: No oral lesions.     Dentition: Normal dentition. No dental caries.  Eyes:     General: No scleral icterus. Cardiovascular:     Rate and Rhythm: Normal rate and regular rhythm.     Heart sounds: Normal heart sounds.  Pulmonary:     Effort: Pulmonary effort is normal.     Breath sounds: Normal breath sounds.  Abdominal:     General: There is no distension.     Palpations: Abdomen is soft.     Tenderness: There is no abdominal tenderness.  Lymphadenopathy:     Cervical: No cervical adenopathy.  Skin:    General: Skin is warm and dry.     Findings: No rash.  Neurological:     Mental Status: He is alert and oriented to person, place, and time.       Assessment & Plan:   Problem List Items Addressed This Visit      Unprioritized   TOBACCO ABUSE    Discussed that quitting would be helpful to manage his blood pressure. He is pre-contemplative at this time.       HIV, well controlled     He is back on his Biktarvy and doing well. Will update viral load and CD4 today. He is taking his regimen correctly without missed doses. Will have pharmacy team  help with co-pay assistance for him.  He was given the flu shot today but other vaccines will need to be updated at next visit.  Return in about 4 months (around 12/24/2019).       Relevant Medications   bictegravir-emtricitabine-tenofovir AF (BIKTARVY) 50-200-25 MG TABS tablet   ESSENTIAL HYPERTENSION, BENIGN    Uncontrolled currently. Will help to get him established with primary care team for close management and preventative care. Refill atenolol.        Other Visit Diagnoses    Asymptomatic HIV infection ([place])    -  Primary   Relevant Medications   bictegravir-emtricitabine-tenofovir AF (BIKTARVY) 50-200-25 MG TABS tablet   Other Relevant Orders   HIV-1 RNA quant-no reflex-bld (Completed)   T-helper cell (CD4)- ([facility] clinic only) (Completed)   Need for  immunization against influenza       Relevant Orders   Flu Vaccine QUAD 36+ mos IM (Completed)      Janene Madeira, MSN, NP-C [facility] for Infectious Disease [email or web address] Pager: [phone] Office: [phone] [place]: [phone]

## 2019-08-26 NOTE — Patient Instructions (Signed)
Nice to meet you!  Please continue your Biktarvy everyday - will repeat your lab work today and let you know the results.   Please schedule a 4 month follow up - we can either do a virtual visit with labs before or in person with same day labs.   Would like to see you get established with a primary care provider to help with your blood pressure and overall wellness/health.

## 2019-08-27 LAB — T-HELPER CELL (CD4) - (RCID CLINIC ONLY)
CD4 % Helper T Cell: 29 % — ABNORMAL LOW (ref 33–65)
CD4 T Cell Abs: 463 /uL (ref 400–1790)

## 2019-08-29 LAB — HIV-1 RNA QUANT-NO REFLEX-BLD
HIV 1 RNA Quant: 44 copies/mL — ABNORMAL HIGH
HIV-1 RNA Quant, Log: 1.64 Log copies/mL — ABNORMAL HIGH

## 2019-08-30 NOTE — Assessment & Plan Note (Signed)
Discussed that quitting would be helpful to manage his blood pressure. He is pre-contemplative at this time.

## 2019-08-30 NOTE — Assessment & Plan Note (Signed)
He is back on his Biktarvy and doing well. Will update viral load and CD4 today. He is taking his regimen correctly without missed doses. Will have pharmacy team help with co-pay assistance for him.  He was given the flu shot today but other vaccines will need to be updated at next visit.  Return in about 4 months (around 12/24/2019).

## 2019-08-30 NOTE — Assessment & Plan Note (Signed)
Uncontrolled currently. Will help to get him established with primary care team for close management and preventative care. Refill atenolol.

## 2019-12-22 ENCOUNTER — Telehealth: Payer: Self-pay

## 2019-12-22 NOTE — Telephone Encounter (Signed)
Called patient to schedule overdue appointment with office. Patient was able to take call and schedule appointments.  During call patient states he has missed at least 6-7 days of medication due to work. Encouraged patient to work medication around work if possible. Also to get key chain holder for medication incase he does forget to take his meds in the morning. Overall doing well; will work on medication schedule. Lorenso Courier, [place]

## 2019-12-24 ENCOUNTER — Telehealth: Payer: Self-pay

## 2019-12-24 NOTE — Telephone Encounter (Signed)
COVID-19 Pre-Screening Questions:12/24/19  Do you currently have a fever (>100 F), chills or unexplained body aches? NO   Are you currently experiencing new cough, shortness of breath, sore throat, runny nose?NO .  Have you recently travelled outside the state of [place] in the last 14 days? NO  .  Have you been in contact with someone that is currently pending confirmation of Covid19 testing or has been confirmed to have the Covid19 virus?  NO  **If the patient answers NO to ALL questions -  advise the patient to please call the clinic before coming to the office should any symptoms develop.     

## 2019-12-25 ENCOUNTER — Other Ambulatory Visit: Payer: Commercial Managed Care - PPO

## 2020-01-06 ENCOUNTER — Telehealth: Payer: Self-pay | Admitting: *Deleted

## 2020-01-06 NOTE — Telephone Encounter (Signed)
Prior authorization requested for patient's Biktarvy. Faxed form to [phone]. Patient scheduled to see Judeth Cornfield 6/7. Andree Coss, RN

## 2020-01-07 NOTE — Telephone Encounter (Signed)
Left message with patient letting him know the medication was authorized by insurance, and reminded him of appointment 6/7 11:00 with [place]. He no-showed his labs las week. RN let him know we can draw labs at the visit, but asked him to please keep the appointment. Andree Coss, RN

## 2020-01-11 ENCOUNTER — Ambulatory Visit: Payer: Commercial Managed Care - PPO | Admitting: Infectious Diseases

## 2020-01-22 ENCOUNTER — Other Ambulatory Visit: Payer: Self-pay | Admitting: Infectious Diseases

## 2020-01-22 DIAGNOSIS — B20 Human immunodeficiency virus [HIV] disease: Secondary | ICD-10-CM

## 2020-03-07 ENCOUNTER — Telehealth: Payer: Self-pay | Admitting: *Deleted

## 2020-03-07 NOTE — Telephone Encounter (Signed)
Received fax from [facility] stating they were having difficulty getting in touch with the patient to schedule medication shipment. RN called patient, left message asking Gregory Whitney to call pharmacy and to call his doctor's office for an appointment. Patient no-showed last appointment. Andree Coss, RN

## 2020-03-28 ENCOUNTER — Telehealth: Payer: Self-pay

## 2020-03-28 ENCOUNTER — Other Ambulatory Visit: Payer: Self-pay | Admitting: Infectious Diseases

## 2020-03-28 DIAGNOSIS — B20 Human immunodeficiency virus [HIV] disease: Secondary | ICD-10-CM

## 2020-03-28 NOTE — Telephone Encounter (Signed)
Attempted to call patient regarding medication refill and overdue appointment. Left patient a VM to call the office to schedule.  Valarie Cones

## 2020-05-02 ENCOUNTER — Other Ambulatory Visit: Payer: Self-pay | Admitting: Infectious Diseases

## 2020-05-02 DIAGNOSIS — B20 Human immunodeficiency virus [HIV] disease: Secondary | ICD-10-CM

## 2020-05-05 ENCOUNTER — Telehealth: Payer: Self-pay | Admitting: Pharmacy Technician

## 2020-05-05 NOTE — Telephone Encounter (Signed)
[facility] Patient Advocate Encounter  Patient enrolled themselves for the 30 day conditional fill of Biktarvy.  I completed and provider signed needed provider portion.  It was faxed in and we will follow until approved for additional months of assistance.    BIN      [id number] PCN    [id number] GRP    [id number] ID        [id number]   Jeannette How, CPhT Specialty Pharmacy Patient [facility] Phone: [phone] Fax:  [phone]

## 2020-05-09 ENCOUNTER — Other Ambulatory Visit: Payer: Self-pay

## 2020-05-09 DIAGNOSIS — Z113 Encounter for screening for infections with a predominantly sexual mode of transmission: Secondary | ICD-10-CM

## 2020-05-09 DIAGNOSIS — B20 Human immunodeficiency virus [HIV] disease: Secondary | ICD-10-CM

## 2020-05-09 DIAGNOSIS — Z79899 Other long term (current) drug therapy: Secondary | ICD-10-CM

## 2020-05-10 ENCOUNTER — Telehealth: Payer: Self-pay | Admitting: *Deleted

## 2020-05-10 NOTE — Telephone Encounter (Signed)
Received call from patient's insurance company regarding specialty medicine. Starting 04/06/2020, [employer] will no longer cover specialty medications, patient will have to cover this at 100% out of pocket.  RN requested [employer] fax [place] this information. Patient overdue for visit, is scheduled for labs 10/7 and follow up 10/26 with [facility]. He got a 30 day supply of Biktarvy (enrolled himself) 9/30 per pharmacy. Will 'cc Don for ADAP. Andree Coss, RN

## 2020-05-12 ENCOUNTER — Other Ambulatory Visit: Payer: Commercial Managed Care - PPO

## 2020-05-12 NOTE — Telephone Encounter (Signed)
Called patient and left voice message for patient to call office to complete his ADAP application.. Still waiting on the fax of denial for his medication. RN. Notified.

## 2020-05-13 ENCOUNTER — Other Ambulatory Visit: Payer: Commercial Managed Care - PPO

## 2020-05-16 ENCOUNTER — Other Ambulatory Visit (HOSPITAL_COMMUNITY)
Admission: RE | Admit: 2020-05-16 | Discharge: 2020-05-16 | Disposition: A | Discharge status: Home / Self Care | Payer: Commercial Managed Care - PPO | Source: Ambulatory Visit | Attending: Infectious Diseases | Admitting: Infectious Diseases

## 2020-05-16 ENCOUNTER — Other Ambulatory Visit: Payer: Commercial Managed Care - PPO

## 2020-05-16 ENCOUNTER — Other Ambulatory Visit: Payer: Self-pay

## 2020-05-16 DIAGNOSIS — B20 Human immunodeficiency virus [HIV] disease: Secondary | ICD-10-CM | POA: Insufficient documentation

## 2020-05-16 DIAGNOSIS — Z79899 Other long term (current) drug therapy: Secondary | ICD-10-CM

## 2020-05-16 DIAGNOSIS — Z113 Encounter for screening for infections with a predominantly sexual mode of transmission: Secondary | ICD-10-CM | POA: Insufficient documentation

## 2020-05-17 LAB — T-HELPER CELL (CD4) - (RCID CLINIC ONLY)
CD4 % Helper T Cell: 29 % — ABNORMAL LOW (ref 33–65)
CD4 T Cell Abs: 349 /uL — ABNORMAL LOW (ref 400–1790)

## 2020-05-17 LAB — URINE CYTOLOGY ANCILLARY ONLY
Chlamydia: NEGATIVE
Comment: NEGATIVE
Comment: NORMAL
Neisseria Gonorrhea: NEGATIVE

## 2020-05-18 LAB — CBC WITH DIFFERENTIAL/PLATELET
Absolute Monocytes: 339 cells/uL (ref 200–950)
Basophils Absolute: 20 cells/uL (ref 0–200)
Basophils Relative: 0.5 %
Eosinophils Absolute: 82 cells/uL (ref 15–500)
Eosinophils Relative: 2.1 %
HCT: 41.7 % (ref 38.5–50.0)
Hemoglobin: 14 g/dL (ref 13.2–17.1)
Lymphs Abs: 1264 cells/uL (ref 850–3900)
MCH: 29.5 pg (ref 27.0–33.0)
MCHC: 33.6 g/dL (ref 32.0–36.0)
MCV: 88 fL (ref 80.0–100.0)
MPV: 10.3 fL (ref 7.5–12.5)
Monocytes Relative: 8.7 %
Neutro Abs: 2196 cells/uL (ref 1500–7800)
Neutrophils Relative %: 56.3 %
Platelets: 293 10*3/uL (ref 140–400)
RBC: 4.74 10*6/uL (ref 4.20–5.80)
RDW: 13.2 % (ref 11.0–15.0)
Total Lymphocyte: 32.4 %
WBC: 3.9 10*3/uL (ref 3.8–10.8)

## 2020-05-18 LAB — HIV-1 RNA QUANT-NO REFLEX-BLD
HIV 1 RNA Quant: 42 Copies/mL — ABNORMAL HIGH
HIV-1 RNA Quant, Log: 1.62 Log cps/mL — ABNORMAL HIGH

## 2020-05-18 LAB — RPR: RPR Ser Ql: NONREACTIVE

## 2020-05-18 LAB — COMPLETE METABOLIC PANEL WITH GFR
AG Ratio: 1.3 (calc) (ref 1.0–2.5)
ALT: 10 U/L (ref 9–46)
AST: 11 U/L (ref 10–40)
Albumin: 3.7 g/dL (ref 3.6–5.1)
Alkaline phosphatase (APISO): 48 U/L (ref 36–130)
BUN: 14 mg/dL (ref 7–25)
CO2: 26 mmol/L (ref 20–32)
Calcium: 8.6 mg/dL (ref 8.6–10.3)
Chloride: 108 mmol/L (ref 98–110)
Creat: 0.94 mg/dL (ref 0.60–1.35)
GFR, Est African American: 113 mL/min/{1.73_m2} (ref >=60)
GFR, Est Non African American: 98 mL/min/{1.73_m2} (ref >=60)
Globulin: 2.8 g/dL (calc) (ref 1.9–3.7)
Glucose, Bld: 111 mg/dL — ABNORMAL HIGH (ref 65–99)
Potassium: 3.9 mmol/L (ref 3.5–5.3)
Sodium: 139 mmol/L (ref 135–146)
Total Bilirubin: 0.5 mg/dL (ref 0.2–1.2)
Total Protein: 6.5 g/dL (ref 6.1–8.1)

## 2020-05-18 LAB — LIPID PANEL
Cholesterol: 155 mg/dL (ref <200)
HDL: 54 mg/dL (ref >=40)
LDL Cholesterol (Calc): 85 mg/dL (calc)
Non-HDL Cholesterol (Calc): 101 mg/dL (calc) (ref <130)
Total CHOL/HDL Ratio: 2.9 (calc) (ref <5.0)
Triglycerides: 72 mg/dL (ref <150)

## 2020-05-27 ENCOUNTER — Other Ambulatory Visit: Payer: Self-pay | Admitting: Infectious Diseases

## 2020-05-27 DIAGNOSIS — B20 Human immunodeficiency virus [HIV] disease: Secondary | ICD-10-CM

## 2020-05-30 ENCOUNTER — Other Ambulatory Visit: Payer: Self-pay

## 2020-05-30 DIAGNOSIS — B20 Human immunodeficiency virus [HIV] disease: Secondary | ICD-10-CM

## 2020-05-30 NOTE — Progress Notes (Signed)
error 

## 2020-05-31 ENCOUNTER — Other Ambulatory Visit: Payer: Self-pay | Admitting: Infectious Diseases

## 2020-05-31 ENCOUNTER — Encounter: Payer: Commercial Managed Care - PPO | Admitting: Infectious Diseases

## 2020-05-31 DIAGNOSIS — B20 Human immunodeficiency virus [HIV] disease: Secondary | ICD-10-CM

## 2020-06-21 ENCOUNTER — Ambulatory Visit: Payer: Commercial Managed Care - PPO | Admitting: Infectious Diseases

## 2020-07-05 ENCOUNTER — Telehealth: Payer: Self-pay

## 2020-07-05 ENCOUNTER — Other Ambulatory Visit: Payer: Self-pay | Admitting: Infectious Diseases

## 2020-07-05 DIAGNOSIS — B20 Human immunodeficiency virus [HIV] disease: Secondary | ICD-10-CM

## 2020-07-05 NOTE — Telephone Encounter (Signed)
Received refill request for patient's Biktarvy, patient needs labs and appointment. Left HIPAA compliant voicemail requesting callback to schedule.  Sandie Ano, RN

## 2020-08-05 ENCOUNTER — Other Ambulatory Visit: Payer: Self-pay | Admitting: Infectious Diseases

## 2020-08-05 DIAGNOSIS — B20 Human immunodeficiency virus [HIV] disease: Secondary | ICD-10-CM

## 2020-09-01 ENCOUNTER — Telehealth: Payer: Self-pay | Admitting: *Deleted

## 2020-09-01 ENCOUNTER — Other Ambulatory Visit: Payer: Self-pay | Admitting: *Deleted

## 2020-09-01 DIAGNOSIS — B20 Human immunodeficiency virus [HIV] disease: Secondary | ICD-10-CM

## 2020-09-01 MED ORDER — BIKTARVY 50-200-25 MG PO TABS: 1.0000 | ORAL_TABLET | Freq: Every day | ORAL | 0 refills | Status: DC

## 2020-09-01 NOTE — Telephone Encounter (Signed)
Last visit 08/26/19. Received refill request from [facility]. Left message stating a 30 day supply of medication was sent in, but we needed to see the patient for future refills. RN sent in 30 day supply to pharmacy with note asking them for their help in relaying need for appointment to patient. Andree Coss, RN

## 2020-09-29 ENCOUNTER — Other Ambulatory Visit: Payer: Self-pay | Admitting: Infectious Diseases

## 2020-09-29 DIAGNOSIS — B20 Human immunodeficiency virus [HIV] disease: Secondary | ICD-10-CM

## 2020-10-11 ENCOUNTER — Other Ambulatory Visit: Payer: Self-pay | Admitting: Infectious Diseases

## 2020-10-11 ENCOUNTER — Telehealth: Payer: Self-pay | Admitting: *Deleted

## 2020-10-11 DIAGNOSIS — B20 Human immunodeficiency virus [HIV] disease: Secondary | ICD-10-CM

## 2020-10-11 NOTE — Telephone Encounter (Signed)
Received refill request for Biktarvy. Patient's last office visit was 08/26/2019. There have been multiple attempts to contact patient to set up appointment. Last Biktarvy refill rejected with note that he needed an appointment. Pharmacy submitted another request today. RN contacted patient, explained he was overdue for a visit. He just opened 1 new bottle. RN scheduled patient for labs and visit.  Explained he will get additional refills at the visit. Andree Coss, RN

## 2020-10-12 ENCOUNTER — Other Ambulatory Visit: Payer: Commercial Managed Care - PPO

## 2020-10-12 ENCOUNTER — Other Ambulatory Visit: Payer: Self-pay

## 2020-10-12 DIAGNOSIS — Z21 Asymptomatic human immunodeficiency virus [HIV] infection status: Secondary | ICD-10-CM

## 2020-10-28 ENCOUNTER — Ambulatory Visit: Payer: Commercial Managed Care - PPO | Admitting: Family

## 2020-11-05 ENCOUNTER — Other Ambulatory Visit: Payer: Self-pay | Admitting: Infectious Diseases

## 2020-11-05 DIAGNOSIS — B20 Human immunodeficiency virus [HIV] disease: Secondary | ICD-10-CM

## 2020-11-07 ENCOUNTER — Telehealth: Payer: Self-pay

## 2020-11-07 NOTE — Telephone Encounter (Signed)
Received refill request for Biktarvy. Patient's las office visit 08/26/19. I have attempted to contact the patient to schedule him an office visit. Patient did not answer and no secured voicemail setup. Biktarvy request rejected and note to the pharmacy patient needs to contact the office. Flor Whitacre T Pricilla Loveless

## 2020-11-22 ENCOUNTER — Telehealth: Payer: Self-pay

## 2020-11-22 ENCOUNTER — Other Ambulatory Visit: Payer: Self-pay | Admitting: Infectious Diseases

## 2020-11-22 DIAGNOSIS — B20 Human immunodeficiency virus [HIV] disease: Secondary | ICD-10-CM

## 2020-11-22 NOTE — Telephone Encounter (Signed)
Received refill request for patient's Biktarvy, patient has not been seen by provider since January 2021. RN attempted to call patient to schedule follow up appointment, no answer Left HIPAA compliant voicemail requesting callback. Refill request denied with note to pharmacy stating patient needs follow-up appointment.   Sandie Ano, RN

## 2021-01-04 ENCOUNTER — Other Ambulatory Visit: Payer: Self-pay | Admitting: Pharmacist

## 2021-01-04 ENCOUNTER — Telehealth: Payer: Self-pay

## 2021-01-04 DIAGNOSIS — Z9114 Patient's other noncompliance with medication regimen: Secondary | ICD-10-CM

## 2021-01-04 DIAGNOSIS — B20 Human immunodeficiency virus [HIV] disease: Secondary | ICD-10-CM

## 2021-01-04 NOTE — Telephone Encounter (Signed)
Patient was scheduled for follow up appointment with [facility] and labs. Requested refill on his Biktarvy. After chart review, patient was sent 1 month supply in January. Patient stated he's continued to receive refills since then from the pharmacy. RN contacted pharmacy who stated they haven't sent any medications to him since February 2022. Contacted patient again for clarification. Patient stated he's been taking 1 pill every other day for the last 6 months. RN instructed patient to take 1 pill daily and that no more refills would be given until he's seen in clinic for lab work (including genotype) and with provider. Patient verbalized understanding. Additionally educated on the importance of adhering to daily regimen due to concerns for resistance development.   Cassie, PharmD notified and orders placed for lab work.

## 2021-01-04 NOTE — Telephone Encounter (Signed)
Yes! Placed order for genotype with RTI, PI + integrase.

## 2021-01-06 ENCOUNTER — Other Ambulatory Visit: Payer: Commercial Managed Care - PPO

## 2021-01-06 ENCOUNTER — Other Ambulatory Visit: Payer: Self-pay | Admitting: Infectious Diseases

## 2021-01-06 ENCOUNTER — Other Ambulatory Visit: Payer: Self-pay

## 2021-01-06 DIAGNOSIS — Z21 Asymptomatic human immunodeficiency virus [HIV] infection status: Secondary | ICD-10-CM

## 2021-01-06 DIAGNOSIS — B20 Human immunodeficiency virus [HIV] disease: Secondary | ICD-10-CM

## 2021-01-10 ENCOUNTER — Other Ambulatory Visit: Payer: Self-pay

## 2021-01-10 ENCOUNTER — Other Ambulatory Visit: Payer: Commercial Managed Care - PPO

## 2021-01-10 DIAGNOSIS — Z21 Asymptomatic human immunodeficiency virus [HIV] infection status: Secondary | ICD-10-CM

## 2021-01-10 DIAGNOSIS — B20 Human immunodeficiency virus [HIV] disease: Secondary | ICD-10-CM

## 2021-01-14 LAB — CBC WITH DIFFERENTIAL/PLATELET
Absolute Monocytes: 546 cells/uL (ref 200–950)
Basophils Absolute: 31 cells/uL (ref 0–200)
Basophils Relative: 0.6 %
Eosinophils Absolute: 219 cells/uL (ref 15–500)
Eosinophils Relative: 4.3 %
HCT: 45.9 % (ref 38.5–50.0)
Hemoglobin: 15.2 g/dL (ref 13.2–17.1)
Lymphs Abs: 1637 cells/uL (ref 850–3900)
MCH: 29.2 pg (ref 27.0–33.0)
MCHC: 33.1 g/dL (ref 32.0–36.0)
MCV: 88.3 fL (ref 80.0–100.0)
MPV: 9.8 fL (ref 7.5–12.5)
Monocytes Relative: 10.7 %
Neutro Abs: 2667 cells/uL (ref 1500–7800)
Neutrophils Relative %: 52.3 %
Platelets: 299 10*3/uL (ref 140–400)
RBC: 5.2 10*6/uL (ref 4.20–5.80)
RDW: 12.3 % (ref 11.0–15.0)
Total Lymphocyte: 32.1 %
WBC: 5.1 10*3/uL (ref 3.8–10.8)

## 2021-01-14 LAB — COMPLETE METABOLIC PANEL WITH GFR
AG Ratio: 1.1 (calc) (ref 1.0–2.5)
ALT: 11 U/L (ref 9–46)
AST: 14 U/L (ref 10–40)
Albumin: 3.7 g/dL (ref 3.6–5.1)
Alkaline phosphatase (APISO): 71 U/L (ref 36–130)
BUN: 9 mg/dL (ref 7–25)
CO2: 22 mmol/L (ref 20–32)
Calcium: 8.8 mg/dL (ref 8.6–10.3)
Chloride: 105 mmol/L (ref 98–110)
Creat: 0.82 mg/dL (ref 0.60–1.35)
GFR, Est African American: 124 mL/min/{1.73_m2} (ref >=60)
GFR, Est Non African American: 107 mL/min/{1.73_m2} (ref >=60)
Globulin: 3.4 g/dL (calc) (ref 1.9–3.7)
Glucose, Bld: 73 mg/dL (ref 65–99)
Potassium: 4.1 mmol/L (ref 3.5–5.3)
Sodium: 139 mmol/L (ref 135–146)
Total Bilirubin: 0.3 mg/dL (ref 0.2–1.2)
Total Protein: 7.1 g/dL (ref 6.1–8.1)

## 2021-01-14 LAB — T-HELPER CELLS (CD4) COUNT (NOT AT ARMC)
Absolute CD4: 577 cells/uL (ref 490–1740)
CD4 T Helper %: 34 % (ref 30–61)
Total lymphocyte count: 1693 cells/uL (ref 850–3900)

## 2021-01-14 LAB — HIV RNA, RTPCR W/R GT (RTI, PI,INT)
HIV 1 RNA Quant: 65 copies/mL — ABNORMAL HIGH
HIV-1 RNA Quant, Log: 1.81 Log copies/mL — ABNORMAL HIGH

## 2021-01-27 NOTE — Progress Notes (Signed)
Subjective:    Patient ID: Gregory Whitney, male    DOB: 1976/04/17, 45 y.o.   MRN: [id number]  HPI:  Gregory Whitney is a 45 y.o. 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    Previous Regimens:  Atripla  Biktarvy    Has been out for 3 weeks now. LOV in January 2021. Has been taking biktarvy every other day for 6 months or so. Had 1 month supply Biktarvy in January 2022 and no further dispenses from pharmacy since that time (verified by ID team 01/04/2021).   Has been off atenolol for over a year now. Does not check BPs at home. He does feel like he has a bit of trouble with alcohol consumption - feels like he can get a better handle on it if he gives it more attention. Not ready to quit smoking yet.   Sleep is poor overall and has been for some time. He uses apps to help with racing thoughts that do provide some help.   Concerned today about his hands. His mother has a h/o RA. He has had progressive stiffness in both hands. Also has problems with shoulders, hips, knees and ankles. No fevers. Does feel like they swell intermittently and throbbing/stiff pain all the time. No fevers chills. Was wondering about STI screening for gonorrhea. Has never had oral / rectal swabs. No GI/GU symptoms. Reports that the hand pain has been at least the same, maybe a bit worse over the last 1 year.    Review of Systems  Constitutional:  Negative for activity change, appetite change, chills, fatigue, fever and unexpected weight change.  HENT:  Negative for sore throat.   Eyes:  Negative for visual disturbance.  Respiratory:  Negative for cough and shortness of breath.   Cardiovascular:  Negative for chest pain and leg swelling.  Gastrointestinal:  Negative for abdominal pain, anal bleeding, diarrhea, nausea and rectal pain.  Genitourinary:  Negative for dysuria, genital sores, penile discharge, penile pain, scrotal swelling and testicular pain.  Musculoskeletal:  Positive for arthralgias (hands  primarily, has also been in shoulders, hips, knees.) and joint swelling.  Skin:  Negative for color change and rash.  Neurological:  Negative for dizziness and headaches.  Hematological:  Negative for adenopathy.  Psychiatric/Behavioral:  Negative for sleep disturbance. The patient is not nervous/anxious.       Objective:     Vitals:   01/30/21 0840  BP: (!) 143/100  Pulse: 89  Temp: 98.1 F (36.7 C)  SpO2: 93%    Body mass index is 23.24 kg/m.    Physical Exam Vitals reviewed.  Constitutional:      Appearance: He is well-developed.     Comments: Seated comfortably in chair during visit.   HENT:     Mouth/Throat:     Mouth: Mucous membranes are moist.     Dentition: Normal dentition. No dental abscesses.     Pharynx: No oropharyngeal exudate.  Eyes:     General: No scleral icterus.    Pupils: Pupils are equal, round, and reactive to light.  Cardiovascular:     Rate and Rhythm: Normal rate and regular rhythm.     Heart sounds: Normal heart sounds.  Pulmonary:     Effort: Pulmonary effort is normal.     Breath sounds: Normal breath sounds.  Abdominal:     General: There is no distension.     Palpations: Abdomen is soft.     Tenderness: There is no abdominal  tenderness.  Musculoskeletal:     Right hand: Deformity and tenderness present. Decreased range of motion.     Left hand: Deformity and tenderness present. Decreased range of motion.     Comments: Bilateral involvement.  No Dupuytren's contracture noted on palms.  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 Small interphalangeal joints appear inflamed.   Lymphadenopathy:     Cervical: No cervical adenopathy.  Skin:    General: Skin is warm and dry.     Findings: No rash.  Neurological:     Mental Status: He is alert and oriented to person, place, and time.  Psychiatric:        Judgment: Judgment normal.     Comments: In good spirits today and engaged in  care discussion.            Assessment & Plan:   Problem List Items Addressed This Visit       Unprioritized   Stiffness of hand joint    Exam and history concerning for progressing, untreated rheumatoid arthritis. 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  Will check xrays of hands and place referral to Rheumatology. He is likely facing loss of insurance in the upcoming months and I would like him to be seen quickly.  If unable to get into rheum off symptoms, history and xray results will have him come back for RF, CCP and ESR/CRP. May consider trial on prednisone while we wait if it gets particularly bad.  Probably will need further evaluation of other joints as well to assess for any destruction.  Explained that it would be very unlikely that gonorrhea would be causing this current problem.        Relevant Orders   DG Hand Complete Left   DG Hand Complete Right   Ambulatory referral to Rheumatology   HIV disease  - Primary    Has had challenges with follow up in the office and was off medication for a while. Has been reportedly taking this every other day for 6 months. Recent viral load was 65 and unable to rune genotype with integrase testing. He was counseled on correct medication administration today and risk for developing acquired drug resistance.   Needs prevnar and menveo today >> given.  STI screening. Needs RPR at next blood draw.   Return in about 4 months (around 06/01/2021).        Relevant Medications   bictegravir-emtricitabine-tenofovir AF (BIKTARVY) 50-200-25 MG TABS tablet   Other Relevant Orders   MENINGOCOCCAL MCV4O (Completed)   Pneumococcal conjugate vaccine 13-valent IM (Completed)   ESSENTIAL HYPERTENSION, BENIGN    Will refill atenolol and have him return in 3-4 months to repeat measurements. Advised to call with any concern for side effect. Should help his intermittent  palpitations also hopefully, though may be anxiety driven.        Relevant Medications   atenolol (TENORMIN) 50 MG tablet   Alcohol use    He does feel like he has a problem with alcohol use.  He would like to try to get a handle on this by his own resources.  He knows to reach out if he feels like he needs help.  Reviewed recent liver function tests were in normal ranges.        Other Visit Diagnoses     HTN (hypertension)       Relevant Medications  atenolol (TENORMIN) 50 MG tablet   Routine screening for STI (sexually transmitted infection)       Relevant Orders   Cytology (oral, anal, urethral) ancillary only   Cytology (oral, anal, urethral) ancillary only   Need for meningococcal vaccination       Relevant Orders   MENINGOCOCCAL MCV4O (Completed)   Need for pneumococcal vaccination       Relevant Orders   Pneumococcal conjugate vaccine 13-valent IM (Completed)      Janene Madeira, MSN, NP-C [facility] [email or web address] Pager: [phone] Office: [phone] [place]: [phone]

## 2021-01-27 NOTE — Assessment & Plan Note (Addendum)
Has had challenges with follow up in the office and was off medication for a while. Has been reportedly taking this every other day for 6 months. Recent viral load was 65 and unable to rune genotype with integrase testing. He was counseled on correct medication administration today and risk for developing acquired drug resistance.   Needs prevnar and menveo today >> given.  STI screening. Needs RPR at next blood draw.   Return in about 4 months (around 06/01/2021).

## 2021-01-30 ENCOUNTER — Encounter: Payer: Self-pay | Admitting: Infectious Diseases

## 2021-01-30 ENCOUNTER — Other Ambulatory Visit (HOSPITAL_COMMUNITY)
Admission: RE | Admit: 2021-01-30 | Discharge: 2021-01-30 | Disposition: A | Discharge status: Home / Self Care | Payer: Commercial Managed Care - PPO | Source: Ambulatory Visit | Attending: Infectious Diseases | Admitting: Infectious Diseases

## 2021-01-30 ENCOUNTER — Other Ambulatory Visit: Payer: Self-pay

## 2021-01-30 ENCOUNTER — Ambulatory Visit
Admission: RE | Admit: 2021-01-30 | Discharge: 2021-01-30 | Disposition: A | Discharge status: Home / Self Care | Payer: Commercial Managed Care - PPO | Source: Ambulatory Visit | Attending: Infectious Diseases | Admitting: Infectious Diseases

## 2021-01-30 ENCOUNTER — Ambulatory Visit (INDEPENDENT_AMBULATORY_CARE_PROVIDER_SITE_OTHER): Payer: Commercial Managed Care - PPO | Admitting: Infectious Diseases

## 2021-01-30 VITALS — BP 143/100 | HR 89 | Temp 98.1°F | Wt 181.0 lb

## 2021-01-30 DIAGNOSIS — Z23 Encounter for immunization: Secondary | ICD-10-CM | POA: Diagnosis not present

## 2021-01-30 DIAGNOSIS — Z789 Other specified health status: Secondary | ICD-10-CM

## 2021-01-30 DIAGNOSIS — M25649 Stiffness of unspecified hand, not elsewhere classified: Secondary | ICD-10-CM | POA: Insufficient documentation

## 2021-01-30 DIAGNOSIS — Z7289 Other problems related to lifestyle: Secondary | ICD-10-CM

## 2021-01-30 DIAGNOSIS — I10 Essential (primary) hypertension: Secondary | ICD-10-CM | POA: Diagnosis not present

## 2021-01-30 DIAGNOSIS — B20 Human immunodeficiency virus [HIV] disease: Secondary | ICD-10-CM

## 2021-01-30 DIAGNOSIS — Z113 Encounter for screening for infections with a predominantly sexual mode of transmission: Secondary | ICD-10-CM

## 2021-01-30 MED ORDER — BIKTARVY 50-200-25 MG PO TABS: 1.0000 | ORAL_TABLET | Freq: Every day | ORAL | 4 refills | Status: DC

## 2021-01-30 MED ORDER — ATENOLOL 50 MG PO TABS: 50.0000 mg | ORAL_TABLET | Freq: Every day | ORAL | 5 refills | Status: DC

## 2021-01-30 NOTE — Assessment & Plan Note (Signed)
He does feel like he has a problem with alcohol use.  He would like to try to get a handle on this by his own resources.  He knows to reach out if he feels like he needs help.  Reviewed recent liver function tests were in normal ranges.

## 2021-01-30 NOTE — Patient Instructions (Signed)
Always nice to see you.   Your medication is working well for you and you are doing a great job taking it as prescribed  Please continue your BIKTARVY 1 pill once a day as we discussed. Refills have been sent in to the mail order pharmacy.   Please stop by to see our financial team on your way out.   Will also refill your blood pressure medication ATENOLOL - one pill once a day. Will recheck your blood pressure next visit so make sure you take it before you come to see me.   Vaccines Recommended:   Prevnar (given today)  Menveo (given today)  Flu shot next appt   Recommended Next Office Visit:   4 months - labs same day   Please continue to be well, stay away from folks that are sick, wash her hands frequently, do not touch her face or mouth and continue to take your medications.

## 2021-01-30 NOTE — Assessment & Plan Note (Signed)
Will refill atenolol and have him return in 3-4 months to repeat measurements. Advised to call with any concern for side effect. Should help his intermittent palpitations also hopefully, though may be anxiety driven.

## 2021-01-30 NOTE — Assessment & Plan Note (Signed)
Exam and history concerning for progressing, untreated rheumatoid arthritis. 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  Will check xrays of hands and place referral to Rheumatology. He is likely facing loss of insurance in the upcoming months and I would like him to be seen quickly.  If unable to get into rheum off symptoms, history and xray results will have him come back for RF, CCP and ESR/CRP. May consider trial on prednisone while we wait if it gets particularly bad.  Probably will need further evaluation of other joints as well to assess for any destruction.  Explained that it would be very unlikely that gonorrhea would be causing this current problem.

## 2021-01-31 LAB — CYTOLOGY, (ORAL, ANAL, URETHRAL) ANCILLARY ONLY
Chlamydia: NEGATIVE
Chlamydia: NEGATIVE
Comment: NEGATIVE
Comment: NEGATIVE
Comment: NORMAL
Comment: NORMAL
Neisseria Gonorrhea: NEGATIVE
Neisseria Gonorrhea: NEGATIVE

## 2021-02-01 ENCOUNTER — Telehealth: Payer: Self-pay

## 2021-02-01 ENCOUNTER — Telehealth: Payer: Self-pay | Admitting: Infectious Diseases

## 2021-02-01 NOTE — Telephone Encounter (Signed)
Faxed completed PA request to [facility] with last office note.   Sandie Ano, RN

## 2021-02-01 NOTE — Telephone Encounter (Signed)
Called and LVM on phone I was trying to reach [place] to answer some questions he had as discussed over mychart.

## 2021-02-02 ENCOUNTER — Encounter: Payer: Self-pay | Admitting: Pharmacist

## 2021-02-02 NOTE — Progress Notes (Signed)
Medication Samples have been provided to the patient.  Drug name: Biktarvy        Strength: 50/200/25 mg       Qty: 14 tablets (2 packs)   LOT: [id number]    Exp.Date: 01/2022  Dosing instructions: Take one tablet by mouth once daily  The patient has been instructed regarding the correct time, dose, and frequency of taking this medication, including desired effects and most common side effects.   Penina Reisner L. Jannette Fogo, PharmD, BCIDP, AAHIVP, CPP Clinical Pharmacist Practitioner Infectious Diseases Clinical Pharmacist [facility] 07/18/2020, 10:07 AM

## 2021-02-02 NOTE — Telephone Encounter (Signed)
I'll put them up front for her!

## 2021-02-07 ENCOUNTER — Telehealth: Payer: Self-pay

## 2021-02-07 ENCOUNTER — Other Ambulatory Visit (HOSPITAL_COMMUNITY): Payer: Self-pay

## 2021-02-07 NOTE — Telephone Encounter (Signed)
[facility] Patient Advocate Encounter  Prior Authorization for Susanne Borders has been approved.     Effective dates: 02/01/21 through 02/01/22  Prescription will need to be filled at [facility].  [facility] will continue to follow.  Clearance Coots, CPhT Specialty Pharmacy Patient [facility] Phone: [phone] Fax:  [phone]

## 2021-02-07 NOTE — Telephone Encounter (Signed)
[facility] Patient Advocate Encounter   Was successful in obtaining a [place] copay card for [employer].  This copay card will make the patients copay 0.00.  I have spoken with the patient.    The billing information is as follows and has been shared with [employer].         Clearance Coots, CPhT Specialty Pharmacy Patient [facility] Phone: [phone] Fax:  [phone]

## 2021-03-06 NOTE — Progress Notes (Deleted)
Office Visit Note  Patient: Gregory Whitney             Date of Birth: 1976-07-07           MRN: [id number]             PCP: Blanchard Kelch, NP Referring: Blanchard Kelch, NP Visit Date: 03/07/2021 Occupation: @GUAROCC @  Subjective:  No chief complaint on file.   History of Present Illness: Gregory Whitney is a 45 y.o. male here for chronic deforming arthritis of bilateral hands. He has a history of reasonably well controlled HIV and also cigarette and alcohol use.***   Labs reviewed 01/10/21 HIV RNA quant 65 CD4 577 CBC wnl CMP wnl  Imaging reviewed 01/30/21 Bilateral hand xrays IMPRESSION: Severe radiocarpal intercarpal arthritis bilaterally with evidence of osseous erosions. Severe left third MCP joint and moderate index finger DIP joint arthritis with evidence of osseous erosions. Interphalangeal joint arthritis in the right hand most prominent at the index finger distal interphalangeal joint with evidence of osseous erosion and adjacent soft tissue swelling. The above findings are suggestive of inflammatory arthritis such as rheumatoid. Additionally, there is a subacute appearing fracture of the distal aspect of the right fifth metacarpal with volar angulation. Degenerative osteophyte formation versus avulsion injury of the medial aspect of the base of the first digit proximal phalanx in the left hand.  Activities of Daily Living:  Patient reports morning stiffness for *** {minute/hour:19697}.   Patient {ACTIONS;DENIES/REPORTS:21021675::"Denies"} nocturnal pain.  Difficulty dressing/grooming: {ACTIONS;DENIES/REPORTS:21021675::"Denies"} Difficulty climbing stairs: {ACTIONS;DENIES/REPORTS:21021675::"Denies"} Difficulty getting out of chair: {ACTIONS;DENIES/REPORTS:21021675::"Denies"} Difficulty using hands for taps, buttons, cutlery, and/or writing: {ACTIONS;DENIES/REPORTS:21021675::"Denies"}  No Rheumatology ROS completed.   PMFS History:  Patient Active Problem List    Diagnosis Date Noted   Stiffness of hand joint 01/30/2021   Alcohol use 01/30/2021   TOBACCO ABUSE 02/22/2010   ESSENTIAL HYPERTENSION, BENIGN 11/16/2009   HIV disease  09/02/2009    Past Medical History:  Diagnosis Date   Arthritis    Blood dyscrasia    hiv   Closed fracture of lateral portion of left tibial plateau with routine healing 06/10/2014   HIV (human immunodeficiency virus infection) (HCC)    Hypertension    no med at present-stopped(atenolol) by self 8 months ago   Leg fracture    left    Family History  Problem Relation Age of Onset   Hypertension Father    Past Surgical History:  Procedure Laterality Date   LEG SURGERY Right 2011   ?boil upper thigh   ORIF TIBIA PLATEAU Left 06/10/2014   Procedure: OPEN REDUCTION INTERNAL FIXATION (ORIF) TIBIAL PLATEAU;  Surgeon: 13/12/2013, MD;  Location: [facility] OR;  Service: Orthopedics;  Laterality: Left;   Social History   Social History Narrative   Not on file   Immunization History  Administered Date(s) Administered   Hepatitis A 02/22/2010, 11/29/2010   Hepatitis B 11/16/2009, 02/22/2010, 06/07/2010   Influenza Split 10/10/2011, 10/29/2012   Influenza Whole 10/24/2009, 06/07/2010   Influenza,inj,Quad PF,6+ Mos 08/10/2015, 08/26/2019   Meningococcal Mcv4o 01/30/2021   Pneumococcal Conjugate-13 01/30/2021   Pneumococcal Polysaccharide-23 10/24/2009, 08/10/2015     Objective: Vital Signs: There were no vitals taken for this visit.   Physical Exam   Musculoskeletal Exam: ***  CDAI Exam: CDAI Score: -- Patient Global: --; Provider Global: -- Swollen: --; Tender: -- Joint Exam 03/07/2021   No joint exam has been documented for this visit   There is currently no information documented on the homunculus.  Go to the Rheumatology activity and complete the homunculus joint exam.  Investigation: No additional findings.  Imaging: No results found.  Recent Labs: Lab Results  Component Value Date   WBC 5.1  01/10/2021   HGB 15.2 01/10/2021   PLT 299 01/10/2021   NA 139 01/10/2021   K 4.1 01/10/2021   CL 105 01/10/2021   CO2 22 01/10/2021   GLUCOSE 73 01/10/2021   BUN 9 01/10/2021   CREATININE 0.82 01/10/2021   BILITOT 0.3 01/10/2021   ALKPHOS 104 08/10/2015   AST 14 01/10/2021   ALT 11 01/10/2021   PROT 7.1 01/10/2021   ALBUMIN 4.0 08/10/2015   CALCIUM 8.8 01/10/2021   GFRAA 124 01/10/2021    Speciality Comments: No specialty comments available.  Procedures:  No procedures performed Allergies: Patient has no known allergies.   Assessment / Plan:     Visit Diagnoses: No diagnosis found.  Orders: No orders of the defined types were placed in this encounter.  No orders of the defined types were placed in this encounter.   Face-to-face time spent with patient was *** minutes. Greater than 50% of time was spent in counseling and coordination of care.  Follow-Up Instructions: No follow-ups on file.   Fuller Plan, MD  Note - This record has been created using [employer].  Chart creation errors have been sought, but may not always  have been located. Such creation errors do not reflect on  the standard of medical care.

## 2021-03-07 ENCOUNTER — Ambulatory Visit: Payer: Commercial Managed Care - PPO | Admitting: Internal Medicine

## 2021-03-28 ENCOUNTER — Other Ambulatory Visit (HOSPITAL_COMMUNITY): Payer: Self-pay

## 2021-03-31 ENCOUNTER — Other Ambulatory Visit: Payer: Self-pay | Admitting: Pharmacist

## 2021-03-31 MED ORDER — BIKTARVY 50-200-25 MG PO TABS: 1.0000 | ORAL_TABLET | Freq: Every day | ORAL | 0 refills | Status: AC

## 2021-03-31 NOTE — Progress Notes (Signed)
Medication Samples have been provided to the patient.  Drug name: Biktarvy        Strength: 50/200/25 mg       Qty: 28 tablets (4 bottles)   LOT: [id number]   Exp.Date: 01/05/2023  Dosing instructions: Take one tablet by mouth once daily  The patient has been instructed regarding the correct time, dose, and frequency of taking this medication, including desired effects and most common side effects.   Laurna Shetley L. Mehar Kirkwood, PharmD, BCIDP, AAHIVP, CPP Clinical Pharmacist Practitioner Infectious Diseases Clinical Pharmacist [facility] 07/18/2020, 10:07 AM  

## 2021-04-21 ENCOUNTER — Encounter: Payer: Self-pay | Admitting: Family

## 2021-05-15 ENCOUNTER — Other Ambulatory Visit: Payer: Self-pay

## 2021-05-15 DIAGNOSIS — B20 Human immunodeficiency virus [HIV] disease: Secondary | ICD-10-CM

## 2021-05-15 MED ORDER — BIKTARVY 50-200-25 MG PO TABS: 1.0000 | ORAL_TABLET | Freq: Every day | ORAL | 0 refills | Status: DC

## 2021-05-18 ENCOUNTER — Other Ambulatory Visit: Payer: Commercial Managed Care - PPO

## 2021-05-23 ENCOUNTER — Other Ambulatory Visit: Payer: Self-pay

## 2021-06-01 ENCOUNTER — Ambulatory Visit: Payer: Commercial Managed Care - PPO | Admitting: Infectious Diseases

## 2021-06-12 ENCOUNTER — Other Ambulatory Visit: Payer: Self-pay | Admitting: Infectious Diseases

## 2021-06-12 ENCOUNTER — Telehealth: Payer: Self-pay

## 2021-06-12 DIAGNOSIS — B20 Human immunodeficiency virus [HIV] disease: Secondary | ICD-10-CM

## 2021-06-12 NOTE — Telephone Encounter (Signed)
Called patient to get an appointment scheduled for labs and follow up, left voicemail for patient to call back and get scheduled

## 2021-07-05 ENCOUNTER — Other Ambulatory Visit: Payer: Self-pay

## 2021-07-05 DIAGNOSIS — Z9114 Patient's other noncompliance with medication regimen: Secondary | ICD-10-CM

## 2021-07-05 DIAGNOSIS — B20 Human immunodeficiency virus [HIV] disease: Secondary | ICD-10-CM

## 2021-07-08 LAB — HIV RNA, RTPCR W/R GT (RTI, PI,INT)
HIV 1 RNA Quant: 47 copies/mL — ABNORMAL HIGH
HIV-1 RNA Quant, Log: 1.67 Log copies/mL — ABNORMAL HIGH

## 2021-07-21 ENCOUNTER — Ambulatory Visit: Payer: Self-pay

## 2021-07-21 ENCOUNTER — Ambulatory Visit (INDEPENDENT_AMBULATORY_CARE_PROVIDER_SITE_OTHER): Payer: Self-pay | Admitting: Infectious Diseases

## 2021-07-21 ENCOUNTER — Other Ambulatory Visit: Payer: Self-pay

## 2021-07-21 ENCOUNTER — Encounter: Payer: Self-pay | Admitting: Infectious Diseases

## 2021-07-21 VITALS — BP 185/130 | HR 84 | Temp 97.6°F | Resp 16 | Ht 74.0 in | Wt 192.8 lb

## 2021-07-21 DIAGNOSIS — Z23 Encounter for immunization: Secondary | ICD-10-CM

## 2021-07-21 DIAGNOSIS — B20 Human immunodeficiency virus [HIV] disease: Secondary | ICD-10-CM

## 2021-07-21 DIAGNOSIS — Z789 Other specified health status: Secondary | ICD-10-CM

## 2021-07-21 DIAGNOSIS — I10 Essential (primary) hypertension: Secondary | ICD-10-CM

## 2021-07-21 DIAGNOSIS — F1090 Alcohol use, unspecified, uncomplicated: Secondary | ICD-10-CM

## 2021-07-21 MED ORDER — BIKTARVY 50-200-25 MG PO TABS: ORAL_TABLET | ORAL | 11 refills | Status: DC

## 2021-07-21 MED ORDER — ATENOLOL 50 MG PO TABS: 50.0000 mg | ORAL_TABLET | Freq: Every day | ORAL | 5 refills | Status: DC

## 2021-07-21 NOTE — Progress Notes (Deleted)
° °  Covid-19 Vaccination Clinic  Name:  Gregory Whitney    MRN: [id number] DOB: 1975/09/01  07/21/2021  Gregory Whitney was observed post Covid-19 immunization for 15 minutes without incident. He was provided with Vaccine Information Sheet and instruction to access the V-Safe system.   Gregory Whitney was instructed to call 911 with any severe reactions post vaccine: Difficulty breathing  Swelling of face and throat  A fast heartbeat  A bad rash all over body  Dizziness and weakness    *** Covid vaccine administration is NOT RECORDED.  Must document administration and refresh note before signing ***

## 2021-07-21 NOTE — Patient Instructions (Addendum)
Tracking # [id number] [employer].  Naltrexone - may be helpful with cravings down the road.   Start back your Atenolol one pill once a day for your blood pressure - will have this mailed to you.   Come back in 3 months so we can repeat a blood pressure for you - make sure you redo your financial paperwork for ADAP in January to cover you from march to April until your insurance turns on.

## 2021-07-21 NOTE — Progress Notes (Signed)
Subjective:    Patient ID: Gregory Whitney, male    DOB: 21-Nov-1975, 45 y.o.   MRN: [id number]  HPI:  Gregory Whitney is a 45 y.o. 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    Previous Regimens:  Atripla  Biktarvy    HPI:  Has had a hard time getting his medications this month. Has not yet received the shipment from [facility]; worried it was lost.   Concerned about his blood pressure - has not been on atenolol for a while, does not check BPs at home. Does feel like alcohol consumption is not a problem for him, though habitual and relaxing. Pre-contemplative re: tobacco cessation.   Has not seen rheumatology - never went to appt.    Review of Systems  Constitutional:  Negative for activity change, appetite change, chills, fatigue, fever and unexpected weight change.  HENT:  Negative for sore throat.   Eyes:  Negative for visual disturbance.  Respiratory:  Negative for cough and shortness of breath.   Cardiovascular:  Negative for chest pain and leg swelling.  Gastrointestinal:  Negative for abdominal pain, anal bleeding, diarrhea, nausea and rectal pain.  Genitourinary:  Negative for dysuria, genital sores, penile discharge, penile pain, scrotal swelling and testicular pain.  Musculoskeletal:  Positive for arthralgias (hands primarily, has also been in shoulders, hips, knees.) and joint swelling.  Skin:  Negative for color change and rash.  Neurological:  Negative for dizziness and headaches.  Hematological:  Negative for adenopathy.  Psychiatric/Behavioral:  Negative for sleep disturbance. The patient is not nervous/anxious.       Objective:     Vitals:   07/21/21 1114  BP: (!) 185/130  Pulse: 84  Resp: 16  Temp: 97.6 F (36.4 C)  SpO2: 99%    Body mass index is 24.75 kg/m.    Physical Exam Vitals reviewed.  Constitutional:      Appearance: He is well-developed.     Comments: Seated comfortably in chair during visit.   HENT:     Mouth/Throat:      Dentition: Normal dentition. No dental abscesses.  Cardiovascular:     Rate and Rhythm: Normal rate and regular rhythm.     Heart sounds: Normal heart sounds.  Pulmonary:     Effort: Pulmonary effort is normal.     Breath sounds: Normal breath sounds.  Abdominal:     General: There is no distension.     Palpations: Abdomen is soft.     Tenderness: There is no abdominal tenderness.  Lymphadenopathy:     Cervical: No cervical adenopathy.  Skin:    General: Skin is warm and dry.     Findings: No rash.  Neurological:     Mental Status: He is alert and oriented to person, place, and time.  Psychiatric:        Judgment: Judgment normal.     Comments: In good spirits today and engaged in care discussion.         Assessment & Plan:   Problem List Items Addressed This Visit       Unprioritized   HIV disease     Difficulty with getting Biktarvy shipped - fearful that they lost his prescription. Called to get tracking number with him in clinic so he can follow up with [employer].  Otherwise was doing a pretty good job at resuming biktarvy daily. No recent illnesses or new medications.  Needs to schedule financial appt in Jan 2023 to avoid lapse in ADAP  coverage. Vaccine counseling re flu and covid booster - given today   Return in about 3 months (around 10/19/2021).       Relevant Medications   bictegravir-emtricitabine-tenofovir AF (BIKTARVY) 50-200-25 MG TABS tablet   ESSENTIAL HYPERTENSION, BENIGN    Not controlled - recommend to consider reducing to quit alcohol and tobacco.  Refill atenolol today. I presume he will need another agent to control but need him to take this continually before we can titrate / change to see response. Will see him back in 56m.       Relevant Medications   atenolol (TENORMIN) 50 MG tablet   Alcohol use    Discussed and offered rx for naltrexone to help with cravings. He will consider, though does not believe currently a significant problem.        Other Visit Diagnoses     Need for immunization against influenza    -  Primary   Relevant Orders   Flu Vaccine QUAD 31mo+IM (Fluarix, Fluzone & Alfiuria Quad PF) (Completed)   HTN (hypertension)       Relevant Medications   atenolol (TENORMIN) 50 MG tablet   Encounter for immunization       Relevant Orders   Pension scheme manager (Completed)      Janene Madeira, MSN, NP-C [email or web address] [email or web address] Pager: [phone] Office: [phone] [place]: [phone]

## 2021-07-27 ENCOUNTER — Other Ambulatory Visit: Payer: Self-pay | Admitting: Pharmacist

## 2021-07-27 DIAGNOSIS — B20 Human immunodeficiency virus [HIV] disease: Secondary | ICD-10-CM

## 2021-07-27 MED ORDER — BIKTARVY 50-200-25 MG PO TABS: 1.0000 | ORAL_TABLET | Freq: Every day | ORAL | 0 refills | Status: AC

## 2021-07-27 NOTE — Progress Notes (Signed)
Medication Samples have been provided to the patient. ? ?Drug name: Biktarvy        ?Strength: 50/200/25 mg       ?Qty: 2 bottles (14 tablets)   ?LOT: [id number]   ?Exp.Date: 05/07/2023 ? ?Dosing instructions: Take one tablet by mouth once daily ? ?The patient has been instructed regarding the correct time, dose, and frequency of taking this medication, including desired effects and most common side effects.  ? ?Vanissa Strength L. Alpha Chouinard, PharmD, BCIDP, AAHIVP, CPP ?Clinical Pharmacist Practitioner ?Infectious Diseases Clinical Pharmacist ?[facility] ?07/18/2020, 10:07 AM  ?

## 2021-09-05 NOTE — Assessment & Plan Note (Signed)
Not controlled - recommend to consider reducing to quit alcohol and tobacco.  Refill atenolol today. I presume he will need another agent to control but need him to take this continually before we can titrate / change to see response. Will see him back in 7m.

## 2021-09-05 NOTE — Assessment & Plan Note (Signed)
Discussed and offered rx for naltrexone to help with cravings. He will consider, though does not believe currently a significant problem.

## 2021-09-05 NOTE — Assessment & Plan Note (Addendum)
Difficulty with getting Biktarvy shipped - fearful that they lost his prescription. Called to get tracking number with him in clinic so he can follow up with [employer].  Otherwise was doing a pretty good job at resuming biktarvy daily. No recent illnesses or new medications.  Needs to schedule financial appt in Jan 2023 to avoid lapse in ADAP coverage. Vaccine counseling re flu and covid booster - given today   Return in about 3 months (around 10/19/2021).

## 2021-09-15 ENCOUNTER — Ambulatory Visit: Payer: Self-pay | Admitting: Infectious Diseases

## 2021-09-15 ENCOUNTER — Telehealth: Payer: Self-pay

## 2021-09-15 NOTE — Telephone Encounter (Signed)
Attempted to call patient regarding missed appt today. Will need patient to reschedule appt. Left voicemail. Juanita Laster, RMA

## 2021-11-06 ENCOUNTER — Ambulatory Visit: Payer: Self-pay | Admitting: Infectious Diseases

## 2021-11-06 NOTE — Progress Notes (Deleted)
? ?  Subjective:  ? ? Patient ID: Gregory Whitney, male    DOB: 12/25/1975, 46 y.o.   MRN: [id number] ? ?HPI:  ?Gregory Whitney is a 46 y.o. 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  ? ? ?Previous Regimens:  ?Atripla  ?Biktarvy  ? ? ?HPI:  ?Has had a hard time getting his medications this month. Has not yet received the shipment from [facility]; worried it was lost.  ? ?Concerned about his blood pressure - has not been on atenolol for a while, does not check BPs at home. Does feel like alcohol consumption is not a problem for him, though habitual and relaxing. Pre-contemplative re: tobacco cessation.  ? ?Has not seen rheumatology - never went to appt.  ? ? ?Review of Systems  ?Constitutional:  Negative for activity change, appetite change, chills, fatigue, fever and unexpected weight change.  ?HENT:  Negative for sore throat.   ?Eyes:  Negative for visual disturbance.  ?Respiratory:  Negative for cough and shortness of breath.   ?Cardiovascular:  Negative for chest pain and leg swelling.  ?Gastrointestinal:  Negative for abdominal pain, anal bleeding, diarrhea, nausea and rectal pain.  ?Genitourinary:  Negative for dysuria, genital sores, penile discharge, penile pain, scrotal swelling and testicular pain.  ?Musculoskeletal:  Positive for arthralgias (hands primarily, has also been in shoulders, hips, knees.) and joint swelling.  ?Skin:  Negative for color change and rash.  ?Neurological:  Negative for dizziness and headaches.  ?Hematological:  Negative for adenopathy.  ?Psychiatric/Behavioral:  Negative for sleep disturbance. The patient is not nervous/anxious.   ? ?   ?Objective:  ?   ?There were no vitals filed for this visit. ? ? ?There is no height or weight on file to calculate BMI.  ? ? ?Physical Exam ?Vitals reviewed.  ?Constitutional:   ?   Appearance: He is well-developed.  ?   Comments: Seated comfortably in chair during visit.   ?HENT:  ?   Mouth/Throat:  ?   Dentition: Normal dentition. No dental  abscesses.  ?Cardiovascular:  ?   Rate and Rhythm: Normal rate and regular rhythm.  ?   Heart sounds: Normal heart sounds.  ?Pulmonary:  ?   Effort: Pulmonary effort is normal.  ?   Breath sounds: Normal breath sounds.  ?Abdominal:  ?   General: There is no distension.  ?   Palpations: Abdomen is soft.  ?   Tenderness: There is no abdominal tenderness.  ?Lymphadenopathy:  ?   Cervical: No cervical adenopathy.  ?Skin: ?   General: Skin is warm and dry.  ?   Findings: No rash.  ?Neurological:  ?   Mental Status: He is alert and oriented to person, place, and time.  ?Psychiatric:     ?   Judgment: Judgment normal.  ?   Comments: In good spirits today and engaged in care discussion.   ? ?   ? ? ?Assessment & Plan:  ? ?Problem List Items Addressed This Visit   ?None ?Rexene Alberts, MSN, NP-C ?[facility] ?[facility]  ?[email or web address] ?Pager: [phone] ?Office: [phone] ?[facility] Main Line: [phone] ?

## 2022-02-14 ENCOUNTER — Encounter: Payer: Self-pay | Admitting: Infectious Diseases

## 2022-02-14 ENCOUNTER — Ambulatory Visit (INDEPENDENT_AMBULATORY_CARE_PROVIDER_SITE_OTHER): Payer: Self-pay | Admitting: Infectious Diseases

## 2022-02-14 ENCOUNTER — Other Ambulatory Visit: Payer: Self-pay

## 2022-02-14 ENCOUNTER — Ambulatory Visit: Payer: Self-pay

## 2022-02-14 VITALS — BP 153/102 | HR 66 | Temp 98.0°F | Wt 208.0 lb

## 2022-02-14 DIAGNOSIS — B20 Human immunodeficiency virus [HIV] disease: Secondary | ICD-10-CM

## 2022-02-14 DIAGNOSIS — F17200 Nicotine dependence, unspecified, uncomplicated: Secondary | ICD-10-CM

## 2022-02-14 DIAGNOSIS — Z0000 Encounter for general adult medical examination without abnormal findings: Secondary | ICD-10-CM

## 2022-02-14 DIAGNOSIS — I10 Essential (primary) hypertension: Secondary | ICD-10-CM

## 2022-02-14 NOTE — Assessment & Plan Note (Signed)
Flu vaccine due in the fall. Prevnar 20 and Menveo booster due in 2027.  Colon cancer screening on hold currently due to finances. Will continue to assess - he may have insurance coverage available to him in a year.  Diabetes screening today given other CVD risk factors and family history.

## 2022-02-14 NOTE — Assessment & Plan Note (Signed)
Recommended quitting for overall heart health and CVD risk reduction.

## 2022-02-14 NOTE — Assessment & Plan Note (Addendum)
BP Readings from Last 3 Encounters:  02/14/22 (!) 153/102  07/21/21 (!) 185/130  01/30/21 (!) 143/100   Uncontrolled.  I asked him to take his BP medication > 1 hour before his next appt to determine if it is the correct dose. May do better adding amlodipine to regimen vs increasing atenolol - will discuss after labs come back how to approach.   Diabetes screen today. CMP.

## 2022-02-14 NOTE — Progress Notes (Signed)
Subjective:    Patient ID: Gregory Whitney, male    DOB: 1975/11/26, 46 y.o.   MRN: [id number]  HPI:  Gregory Whitney is a 46 y.o. 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  Transmission: sexual   Previous Regimens:  Atripla  Development worker, international aid Complaint  Patient presents with   Follow-up      HPI:  Here for routine follow up care. Gregory Whitney is doing well overall. No illnesses or hospital/ER visits since LOV in December 2022. He has been working full time. No changes with insurance - too expensive at this time and will go forward with renewal of ADAP today. Has had no trouble getting or taking biktarvy. Has been doing well getting this in everyday.   BP medication is going well - took it on the way to today's appt. Still smoking cigarettes and not quite ready to quit.   No concern over anxious or depressed mood. No unexpected weight changes. No sexual health or family planning needs.    Review of Systems  Constitutional:  Negative for chills and fever.  HENT:  Negative for sore throat.        No dental problems  Respiratory:  Negative for cough.   Cardiovascular:  Negative for chest pain and leg swelling.  Gastrointestinal:  Negative for abdominal pain, diarrhea and vomiting.  Genitourinary:  Negative for dysuria and flank pain.  Musculoskeletal:  Negative for myalgias and neck pain.  Skin:  Negative for rash.  Neurological:  Negative for dizziness and headaches.  Psychiatric/Behavioral:  The patient is not nervous/anxious.        Objective:     Vitals:   02/14/22 0845  BP: (!) 153/102  Pulse: 66  Temp: 98 F (36.7 C)    Body mass index is 26.71 kg/m.    Physical Exam Vitals reviewed.  Constitutional:      Appearance: He is well-developed.     Comments: Seated comfortably in chair during visit.   HENT:     Mouth/Throat:     Dentition: Normal dentition. No dental abscesses.  Cardiovascular:     Rate and Rhythm: Normal rate and regular rhythm.     Heart  sounds: Normal heart sounds.  Pulmonary:     Effort: Pulmonary effort is normal.     Breath sounds: Normal breath sounds.  Abdominal:     General: There is no distension.     Palpations: Abdomen is soft.     Tenderness: There is no abdominal tenderness.  Lymphadenopathy:     Cervical: No cervical adenopathy.  Skin:    General: Skin is warm and dry.     Findings: No rash.  Neurological:     Mental Status: He is alert and oriented to person, place, and time.  Psychiatric:        Judgment: Judgment normal.     Comments: In good spirits today and engaged in care discussion.          Assessment & Plan:   Problem List Items Addressed This Visit       Unprioritized   ESSENTIAL HYPERTENSION, BENIGN    BP Readings from Last 3 Encounters:  02/14/22 (!) 153/102  07/21/21 (!) 185/130  01/30/21 (!) 143/100  Uncontrolled.  I asked him to take his BP medication > 1 hour before his next appt to determine if it is the correct dose. May do better adding amlodipine to regimen vs increasing atenolol - will discuss after labs  come back how to approach.   Diabetes screen today. CMP.       Healthcare maintenance    Flu vaccine due in the fall. Prevnar 20 and Menveo booster due in 2027.  Colon cancer screening on hold currently due to finances. Will continue to assess - he may have insurance coverage available to him in a year.  Diabetes screening today given other CVD risk factors and family history.       HIV disease  - Primary    Very well controlled on once daily Biktarvy. No concerns with access or adherence to medication. They are tolerating the medication well without side effects. No drug interactions identified.  Reminded about ADAP re-enrollment today. Will remind at next appt in December/Jan of the same No dental needs today.  No concern over anxious/depressed mood.  Sexual health and family planning discussed including screening needs - no needs identified today.  Vaccines are  up to date - see health maintenance section.   With new research coming out by way of the REPRIEVE study regarding CVD risk reduction for PLWH, discussed that over the 8 year trial initiation of statin therapy was shown to reduce CV disease by 35% independent of other risk factors.  We discussed the patient's 10-year CVD Risk Score to be 16.8%, at least moderately elevated, and would benefit from intervention given HIV+ and > 47 yo.   Pending DM screen to determine dose of statin. He is open to start it. Fasting lipid today.   Return in about 6 months (around 08/17/2022).        Relevant Orders   HIV-1 RNA quant-no reflex-bld   RPR   COMPLETE METABOLIC PANEL WITH GFR   CBC with Differential/Platelet   Lipid panel   T-helper cells (CD4) count   Hemoglobin A1c   Urine cytology ancillary only   TOBACCO ABUSE    Recommended quitting for overall heart health and CVD risk reduction.        Rexene Alberts, MSN, NP-C [facility]  [email or web address] Pager: [phone] Office: [phone] [facility] Main Line: [phone]

## 2022-02-14 NOTE — Assessment & Plan Note (Addendum)
Very well controlled on once daily Biktarvy. No concerns with access or adherence to medication. They are tolerating the medication well without side effects. No drug interactions identified.  Reminded about ADAP re-enrollment today. Will remind at next appt in December/Jan of the same No dental needs today.  No concern over anxious/depressed mood.  Sexual health and family planning discussed including screening needs - no needs identified today.  Vaccines are up to date - see health maintenance section.   With new research coming out by way of the REPRIEVE study regarding CVD risk reduction for PLWH, discussed that over the 8 year trial initiation of statin therapy was shown to reduce CV disease by 35% independent of other risk factors.  We discussed the patient's 10-year CVD Risk Score to be 16.8%, at least moderately elevated, and would benefit from intervention given HIV+ and > 47 yo.   Pending DM screen to determine dose of statin. He is open to start it. Fasting lipid today.   Return in about 6 months (around 08/17/2022).

## 2022-02-14 NOTE — Patient Instructions (Signed)
Would like to see you back in December -   If you can remember take your blood pressure medication at least an hour before you come see me - that way we can tell if it is the right dose  Would recommend starting a cholesterol medication called a STATIN - will call you and let you know more about which one and how to take it once your labs come back. I do think this could offer a very important benefit for your heart health.   No vaccines today.   Please stop by the lab on your way out  No other medication changes today

## 2022-02-15 ENCOUNTER — Ambulatory Visit: Payer: Self-pay | Admitting: Infectious Diseases

## 2022-02-15 LAB — URINE CYTOLOGY ANCILLARY ONLY
Chlamydia: NEGATIVE
Comment: NEGATIVE
Comment: NORMAL
Neisseria Gonorrhea: NEGATIVE

## 2022-02-15 LAB — T-HELPER CELLS (CD4) COUNT (NOT AT ARMC)
CD4 % Helper T Cell: 35 % (ref 33–65)
CD4 T Cell Abs: 487 /uL (ref 400–1790)

## 2022-02-18 LAB — CBC WITH DIFFERENTIAL/PLATELET
Absolute Monocytes: 437 cells/uL (ref 200–950)
Basophils Absolute: 28 cells/uL (ref 0–200)
Basophils Relative: 0.6 %
Eosinophils Absolute: 169 cells/uL (ref 15–500)
Eosinophils Relative: 3.6 %
HCT: 45.1 % (ref 38.5–50.0)
Hemoglobin: 14.9 g/dL (ref 13.2–17.1)
Lymphs Abs: 1678 cells/uL (ref 850–3900)
MCH: 29.3 pg (ref 27.0–33.0)
MCHC: 33 g/dL (ref 32.0–36.0)
MCV: 88.6 fL (ref 80.0–100.0)
MPV: 10.3 fL (ref 7.5–12.5)
Monocytes Relative: 9.3 %
Neutro Abs: 2388 cells/uL (ref 1500–7800)
Neutrophils Relative %: 50.8 %
Platelets: 317 10*3/uL (ref 140–400)
RBC: 5.09 10*6/uL (ref 4.20–5.80)
RDW: 13.2 % (ref 11.0–15.0)
Total Lymphocyte: 35.7 %
WBC: 4.7 10*3/uL (ref 3.8–10.8)

## 2022-02-18 LAB — COMPLETE METABOLIC PANEL WITH GFR
AG Ratio: 1.3 (calc) (ref 1.0–2.5)
ALT: 11 U/L (ref 9–46)
AST: 14 U/L (ref 10–40)
Albumin: 4.3 g/dL (ref 3.6–5.1)
Alkaline phosphatase (APISO): 64 U/L (ref 36–130)
BUN: 11 mg/dL (ref 7–25)
CO2: 24 mmol/L (ref 20–32)
Calcium: 9 mg/dL (ref 8.6–10.3)
Chloride: 106 mmol/L (ref 98–110)
Creat: 1.04 mg/dL (ref 0.60–1.29)
Globulin: 3.3 g/dL (calc) (ref 1.9–3.7)
Glucose, Bld: 74 mg/dL (ref 65–99)
Potassium: 4.2 mmol/L (ref 3.5–5.3)
Sodium: 140 mmol/L (ref 135–146)
Total Bilirubin: 0.4 mg/dL (ref 0.2–1.2)
Total Protein: 7.6 g/dL (ref 6.1–8.1)
eGFR: 90 mL/min/{1.73_m2} (ref >=60)

## 2022-02-18 LAB — LIPID PANEL
Cholesterol: 172 mg/dL (ref <200)
HDL: 53 mg/dL (ref >=40)
LDL Cholesterol (Calc): 95 mg/dL (calc)
Non-HDL Cholesterol (Calc): 119 mg/dL (calc) (ref <130)
Total CHOL/HDL Ratio: 3.2 (calc) (ref <5.0)
Triglycerides: 138 mg/dL (ref <150)

## 2022-02-18 LAB — HIV-1 RNA QUANT-NO REFLEX-BLD
HIV 1 RNA Quant: 22 Copies/mL — ABNORMAL HIGH
HIV-1 RNA Quant, Log: 1.34 Log cps/mL — ABNORMAL HIGH

## 2022-02-18 LAB — HEMOGLOBIN A1C
Hgb A1c MFr Bld: 4.7 % of total Hgb (ref <5.7)
Mean Plasma Glucose: 88 mg/dL
eAG (mmol/L): 4.9 mmol/L

## 2022-02-18 LAB — RPR: RPR Ser Ql: NONREACTIVE

## 2022-02-19 ENCOUNTER — Telehealth: Payer: Self-pay

## 2022-02-19 DIAGNOSIS — B20 Human immunodeficiency virus [HIV] disease: Secondary | ICD-10-CM

## 2022-02-19 DIAGNOSIS — I10 Essential (primary) hypertension: Secondary | ICD-10-CM

## 2022-02-19 MED ORDER — PITAVASTATIN MAGNESIUM 4 MG PO TABS: 4.0000 mg | ORAL_TABLET | Freq: Every day | ORAL | 3 refills | Status: DC

## 2022-02-19 MED ORDER — ATENOLOL 50 MG PO TABS: 50.0000 mg | ORAL_TABLET | Freq: Every day | ORAL | 5 refills | Status: DC

## 2022-02-19 NOTE — Addendum Note (Signed)
Addended by: Linna Hoff D on: 02/19/2022 03:09 PM   Modules accepted: Orders

## 2022-02-19 NOTE — Telephone Encounter (Signed)
Received call from pharmacy requesting refill of atenolol. Will route to provider.   Sandie Ano, RN

## 2022-06-01 ENCOUNTER — Other Ambulatory Visit: Payer: Self-pay | Admitting: Infectious Diseases

## 2022-06-01 DIAGNOSIS — B20 Human immunodeficiency virus [HIV] disease: Secondary | ICD-10-CM

## 2022-07-29 ENCOUNTER — Other Ambulatory Visit: Payer: Self-pay | Admitting: Infectious Diseases

## 2022-07-29 DIAGNOSIS — I10 Essential (primary) hypertension: Secondary | ICD-10-CM

## 2022-07-29 DIAGNOSIS — B20 Human immunodeficiency virus [HIV] disease: Secondary | ICD-10-CM

## 2022-08-01 NOTE — Telephone Encounter (Signed)
Ok to refill 

## 2022-08-01 NOTE — Telephone Encounter (Signed)
1 month supply of atenolol and biktarvy sent to continue - further fills tbd at next appointment.

## 2022-08-15 ENCOUNTER — Other Ambulatory Visit: Payer: Self-pay

## 2022-08-15 ENCOUNTER — Ambulatory Visit: Payer: Self-pay | Admitting: Infectious Diseases

## 2022-08-15 ENCOUNTER — Ambulatory Visit (INDEPENDENT_AMBULATORY_CARE_PROVIDER_SITE_OTHER): Payer: Self-pay

## 2022-08-15 ENCOUNTER — Encounter: Payer: Self-pay | Admitting: Infectious Diseases

## 2022-08-15 VITALS — BP 133/88 | HR 71 | Temp 97.7°F | Wt 217.0 lb

## 2022-08-15 DIAGNOSIS — B20 Human immunodeficiency virus [HIV] disease: Secondary | ICD-10-CM

## 2022-08-15 DIAGNOSIS — Z23 Encounter for immunization: Secondary | ICD-10-CM

## 2022-08-15 DIAGNOSIS — I10 Essential (primary) hypertension: Secondary | ICD-10-CM

## 2022-08-15 DIAGNOSIS — Z0000 Encounter for general adult medical examination without abnormal findings: Secondary | ICD-10-CM

## 2022-08-15 DIAGNOSIS — Z5181 Encounter for therapeutic drug level monitoring: Secondary | ICD-10-CM

## 2022-08-15 DIAGNOSIS — L732 Hidradenitis suppurativa: Secondary | ICD-10-CM | POA: Insufficient documentation

## 2022-08-15 MED ORDER — BIKTARVY 50-200-25 MG PO TABS: ORAL_TABLET | ORAL | 11 refills | Status: DC

## 2022-08-15 MED ORDER — ATENOLOL 50 MG PO TABS: ORAL_TABLET | ORAL | 0 refills | Status: DC

## 2022-08-15 MED ORDER — ZYPITAMAG 4 MG PO TABS: 1.0000 | ORAL_TABLET | Freq: Every day | ORAL | 11 refills | Status: DC

## 2022-08-15 NOTE — Assessment & Plan Note (Addendum)
Very well controlled on once daily Biktarvy. No concerns with access or adherence to medication. They are tolerating the medication well without side effects. No drug interactions identified. Pertinent lab tests ordered today.  No changes to insurance coverage.  No dental needs today.  No concern over anxious/depressed mood.  Sexual health and family planning discussed - no needs today - offered screenings/condoms.  Vaccines updated today - see health maintenance section.    Return in about 6 months (around 02/13/2023).

## 2022-08-15 NOTE — Assessment & Plan Note (Signed)
Will refer to dermatology for assistance in management.

## 2022-08-15 NOTE — Progress Notes (Signed)
Subjective:    Patient ID: Gregory Whitney, male    DOB: April 04, 1976, 47 y.o.   MRN: [id number]  HPI:  Gregory Whitney is a 47 y.o. 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  Transmission: sexual   Previous Regimens:  Atripla  Physicist, medical Complaint  Patient presents with   Follow-up      HPI:  Here for routine follow up care. LOV in July 2023 with VL 22 copies (undetectable range) and CD4 ~500 copies. He continues on [place] everyday without lapse. Has been getting his rx from  [facility].  Continues on atenolol everyday with pitavastatin.  Has never underwent colon cancer screen.  Male partners, versatile partner. Never had anal cancer screening. 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 Gets so bad it sometimes causes him to wear depends. Was told previously that this would not go away and he has just dealt with this.   OK for flu and covid booster today.   No concern over anxious or depressed mood. No unexpected weight changes. No sexual health or family planning needs.    Review of Systems  Constitutional:  Negative for chills and fever.  HENT:  Negative for sore throat.        No dental problems  Respiratory:  Negative for cough.   Cardiovascular:  Negative for chest pain and leg swelling.  Gastrointestinal:  Negative for abdominal pain, diarrhea and vomiting.  Genitourinary:  Negative for dysuria and flank pain.  Musculoskeletal:  Negative for myalgias and neck pain.  Skin:  Negative for rash.  Neurological:  Negative for dizziness and headaches.  Psychiatric/Behavioral:  The patient is not nervous/anxious.        Objective:     Vitals:   08/15/22 1439  BP: 133/88  Pulse: 71  Temp: 97.7 F (36.5 C)  SpO2: 97%    Body mass index is 27.86 kg/m.    Physical Exam Vitals reviewed.  Constitutional:      Appearance: He is well-developed.      Comments: Seated comfortably in chair during visit.   HENT:     Mouth/Throat:     Dentition: Normal dentition. No dental abscesses.  Cardiovascular:     Rate and Rhythm: Normal rate and regular rhythm.     Heart sounds: Normal heart sounds.  Pulmonary:     Effort: Pulmonary effort is normal.     Breath sounds: Normal breath sounds.  Abdominal:     General: There is no distension.     Palpations: Abdomen is soft.     Tenderness: There is no abdominal tenderness.  Lymphadenopathy:     Cervical: No cervical adenopathy.  Skin:    General: Skin is warm and dry.     Findings: No rash.  Neurological:     Mental Status: He is alert and oriented to person, place, and time.  Psychiatric:        Judgment: Judgment normal.     Comments: In good spirits today and engaged in care discussion.         Assessment & Plan:   Problem List Items Addressed This Visit       Unprioritized   Essential hypertension, benign    BP Readings from Last 3 Encounters:  08/15/22 133/88  02/14/22 (!) 153/102  07/21/21 (!) 185/130   Well controlled today on atenolol - no  changes.  Continue pitavastatin for primary prevention as well with at least moderate CVD risk score and PLWH.       Relevant Medications   atenolol (TENORMIN) 50 MG tablet   Pitavastatin Magnesium (ZYPITAMAG) 4 MG TABS   Healthcare maintenance    Flu and COVID booster today.  Recommended anal cancer screening - will consider at next visit.  Colon cancer screening recommended (no family history). He will consider. I think we may be able to arrange a cologuard home screening for him.       Hidradenitis suppurativa of multiple sites    Will refer to dermatology for assistance in management.       Relevant Medications   bictegravir-emtricitabine-tenofovir AF (BIKTARVY) 50-200-25 MG TABS tablet   Other Relevant Orders   Ambulatory referral to Dermatology   HIV disease  - Primary    Very well controlled on once daily Biktarvy.  No concerns with access or adherence to medication. They are tolerating the medication well without side effects. No drug interactions identified. Pertinent lab tests ordered today.  No changes to insurance coverage.  No dental needs today.  No concern over anxious/depressed mood.  Sexual health and family planning discussed - no needs today - offered screenings/condoms.  Vaccines updated today - see health maintenance section.    Return in about 6 months (around 02/13/2023).        Relevant Medications   bictegravir-emtricitabine-tenofovir AF (BIKTARVY) 50-200-25 MG TABS tablet   Pitavastatin Magnesium (ZYPITAMAG) 4 MG TABS   Other Relevant Orders   HIV 1 RNA quant-no reflex-bld   Other Visit Diagnoses     Medication monitoring encounter       Relevant Orders   Hepatic function panel   HTN (hypertension)       Relevant Medications   atenolol (TENORMIN) 50 MG tablet   Pitavastatin Magnesium (ZYPITAMAG) 4 MG TABS       Janene Madeira, MSN, NP-C [facility] [email or web address] Pager: [phone] Office: [phone] [place]: [phone]

## 2022-08-15 NOTE — Assessment & Plan Note (Signed)
Flu and COVID booster today.  Recommended anal cancer screening - will consider at next visit.  Colon cancer screening recommended (no family history). He will consider. I think we may be able to arrange a cologuard home screening for him.

## 2022-08-15 NOTE — Patient Instructions (Addendum)
Continue your biktarvy everyday.   Continue your Atenolol and Pitavastatin everyday as well.   ANCHOR Study for HPV related cancer prevention - take a look at the site and read up about the material. We can talk about this again at your next appointment.   Would recommend we get you considered for colon cancer screening as well with   Will place referral for dermatology for you to see if they can help control the HS -  Hidradenitis Suppuritiva - is the skin condition that I think you have

## 2022-08-15 NOTE — Assessment & Plan Note (Signed)
BP Readings from Last 3 Encounters:  08/15/22 133/88  02/14/22 (!) 153/102  07/21/21 (!) 185/130    Well controlled today on atenolol - no changes.  Continue pitavastatin for primary prevention as well with at least moderate CVD risk score and PLWH.

## 2022-08-18 LAB — HIV-1 RNA QUANT-NO REFLEX-BLD
HIV 1 RNA Quant: 95 Copies/mL — ABNORMAL HIGH
HIV-1 RNA Quant, Log: 1.98 Log cps/mL — ABNORMAL HIGH

## 2022-08-18 LAB — HEPATIC FUNCTION PANEL
AG Ratio: 1.3 (calc) (ref 1.0–2.5)
ALT: 24 U/L (ref 9–46)
AST: 20 U/L (ref 10–40)
Albumin: 4.3 g/dL (ref 3.6–5.1)
Alkaline phosphatase (APISO): 70 U/L (ref 36–130)
Bilirubin, Direct: 0.1 mg/dL (ref 0.0–0.2)
Globulin: 3.3 g/dL (calc) (ref 1.9–3.7)
Indirect Bilirubin: 0.3 mg/dL (calc) (ref 0.2–1.2)
Total Bilirubin: 0.4 mg/dL (ref 0.2–1.2)
Total Protein: 7.6 g/dL (ref 6.1–8.1)

## 2022-10-19 ENCOUNTER — Other Ambulatory Visit: Payer: Self-pay | Admitting: Infectious Diseases

## 2022-10-19 DIAGNOSIS — I10 Essential (primary) hypertension: Secondary | ICD-10-CM

## 2022-11-12 ENCOUNTER — Other Ambulatory Visit: Payer: Self-pay | Admitting: Infectious Diseases

## 2022-11-12 DIAGNOSIS — I10 Essential (primary) hypertension: Secondary | ICD-10-CM

## 2023-02-18 ENCOUNTER — Other Ambulatory Visit: Payer: Self-pay

## 2023-02-18 ENCOUNTER — Other Ambulatory Visit: Payer: Self-pay | Admitting: Infectious Diseases

## 2023-02-18 DIAGNOSIS — I10 Essential (primary) hypertension: Secondary | ICD-10-CM

## 2023-02-22 ENCOUNTER — Ambulatory Visit: Payer: Self-pay

## 2023-03-04 ENCOUNTER — Other Ambulatory Visit: Payer: Self-pay

## 2023-03-04 ENCOUNTER — Encounter: Payer: Self-pay | Admitting: Infectious Diseases

## 2023-03-04 ENCOUNTER — Ambulatory Visit (INDEPENDENT_AMBULATORY_CARE_PROVIDER_SITE_OTHER): Payer: Self-pay | Admitting: Infectious Diseases

## 2023-03-04 VITALS — BP 168/122 | HR 67 | Temp 98.0°F | Ht 74.0 in | Wt 217.0 lb

## 2023-03-04 DIAGNOSIS — I10 Essential (primary) hypertension: Secondary | ICD-10-CM

## 2023-03-04 DIAGNOSIS — B20 Human immunodeficiency virus [HIV] disease: Secondary | ICD-10-CM

## 2023-03-04 DIAGNOSIS — M25649 Stiffness of unspecified hand, not elsewhere classified: Secondary | ICD-10-CM

## 2023-03-04 DIAGNOSIS — F17210 Nicotine dependence, cigarettes, uncomplicated: Secondary | ICD-10-CM

## 2023-03-04 LAB — CBC
HCT: 41.9 % (ref 38.5–50.0)
Hemoglobin: 13.7 g/dL (ref 13.2–17.1)
MCH: 28.1 pg (ref 27.0–33.0)
MCHC: 32.7 g/dL (ref 32.0–36.0)
MCV: 86 fL (ref 80.0–100.0)
MPV: 10 fL (ref 7.5–12.5)
Platelets: 339 10*3/uL (ref 140–400)
RBC: 4.87 10*6/uL (ref 4.20–5.80)
RDW: 13.2 % (ref 11.0–15.0)
WBC: 6 10*3/uL (ref 3.8–10.8)

## 2023-03-04 MED ORDER — ZYPITAMAG 4 MG PO TABS: 1.0000 | ORAL_TABLET | Freq: Every day | ORAL | 11 refills | Status: DC

## 2023-03-04 MED ORDER — BIKTARVY 50-200-25 MG PO TABS: ORAL_TABLET | ORAL | 11 refills | Status: DC

## 2023-03-04 MED ORDER — ATENOLOL 50 MG PO TABS: ORAL_TABLET | ORAL | 11 refills | Status: DC

## 2023-03-04 NOTE — Assessment & Plan Note (Signed)
Referral to rheum when he has access to insurance. Suspect rheumatoid arthritis.

## 2023-03-04 NOTE — Patient Instructions (Addendum)
Nice to see you   If your home blood pressures are < 140 / 90 then we don't need to adjust your blood pressure medicine.   Would like to see you again in the fall to recheck your blood pressure - we may need to increase one of your medications vs adding another agent. Maybe we can find a combination pill for you.   Continue your biktarvy and cholesterol medicine too.   When you have access to insurance message me and I can put in a referral for rheumatology to take a look at your hands - I suspect you have rheumatoid arthritis and would benefit from talking to them to see if they can help stop it from progressing.

## 2023-03-04 NOTE — Assessment & Plan Note (Signed)
Very well controlled on once daily Biktarvy. Healthy cd4 recovery. No concerns with access or adherence to medication. They are tolerating the medication well without side effects. No drug interactions identified. Pertinent lab tests ordered today.  Reminded about ADAP re-enrollment in July - may have access to insurance in 90d or so  No dental needs today.  No concern over anxious/depressed mood.  Sexual health and family planning discussed - unable to provide urine today.  Vaccines updated today - see health maintenance section.  No need for anal cancer screening.  On pitavastatin for primary cardiac prevention.   Return in about 4 months (around 07/14/2023).

## 2023-03-04 NOTE — Assessment & Plan Note (Addendum)
Quite hypertensive today. Took meds before he arrived here today. He is asymptomatic.  He thinks it is d/t heavy alcohol intake this weekend. He does have an ability to check at home - we went over how to do this and to message me if persistently > 140/90 - will add amlodipine 5 mg daily and increase atenolol to 100 mg daily.  Will see if we can get him into [facility] for assistance in management as well.

## 2023-03-04 NOTE — Progress Notes (Signed)
Subjective:    Patient ID: Gregory Whitney, male    DOB: [birth year], 47 y.o.   MRN: [id number]  HPI:  Gregory Whitney is a 47 y.o. 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  Transmission: sexual   Previous Regimens:  Atripla  Biktarvy     No chief complaint on file.     HPI:  Here for routine follow up care. LOV in July 2023 with VL 22 copies (undetectable range) and CD4 ~500 copies. He continues on [place] everyday without lapse. Has been getting his rx from  [facility].  Continues on atenolol everyday with pitavastatin.  Has never underwent colon cancer screen.  Male partners, versatile partner. Never had anal cancer screening. 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 Gets so bad it sometimes causes him to wear depends. Was told previously that this would not go away and he has just dealt with this.   OK for flu and covid booster today.   No concern over anxious or depressed mood. No unexpected weight changes. No sexual health or family planning needs.    Review of Systems  Constitutional:  Negative for chills and fever.  HENT:  Negative for sore throat.        No dental problems  Respiratory:  Negative for cough.   Cardiovascular:  Negative for chest pain and leg swelling.  Gastrointestinal:  Negative for abdominal pain, diarrhea and vomiting.  Genitourinary:  Negative for dysuria and flank pain.  Musculoskeletal:  Negative for myalgias and neck pain.  Skin:  Negative for rash.  Neurological:  Negative for dizziness and headaches.  Psychiatric/Behavioral:  The patient is not nervous/anxious.        Objective:     Vitals:   03/04/23 0934 03/04/23 0954  BP: (!) 190/132 (!) 168/122  Pulse: 67   Temp: 98 F (36.7 C)   SpO2: 99%     Body mass index is 27.86 kg/m.    Physical Exam Vitals reviewed.  Constitutional:      Appearance: He is  well-developed.     Comments: Seated comfortably in chair during visit.   HENT:     Mouth/Throat:     Dentition: Normal dentition. No dental abscesses.  Cardiovascular:     Rate and Rhythm: Normal rate and regular rhythm.     Heart sounds: Normal heart sounds.  Pulmonary:     Effort: Pulmonary effort is normal.     Breath sounds: Normal breath sounds.  Abdominal:     General: There is no distension.     Palpations: Abdomen is soft.     Tenderness: There is no abdominal tenderness.  Lymphadenopathy:     Cervical: No cervical adenopathy.  Skin:    General: Skin is warm and dry.     Findings: No rash.  Neurological:     Mental Status: He is alert and oriented to person, place, and time.  Psychiatric:        Judgment: Judgment normal.     Comments: In good spirits today and engaged in care discussion.         Assessment & Plan:   Problem List Items Addressed This Visit       Unprioritized   Stiffness of hand joint    Referral to rheum when he has access to insurance. Suspect rheumatoid arthritis.       HIV disease  Very well controlled on once daily Biktarvy. Healthy cd4 recovery. No concerns with access or adherence to medication. They are tolerating the medication well without side effects. No drug interactions identified. Pertinent lab tests ordered today.  Reminded about ADAP re-enrollment in July - may have access to insurance in 90d or so  No dental needs today.  No concern over anxious/depressed mood.  Sexual health and family planning discussed - unable to provide urine today.  Vaccines updated today - see health maintenance section.  No need for anal cancer screening.  On pitavastatin for primary cardiac prevention.   Return in about 4 months (around 07/14/2023).        Relevant Medications   bictegravir-emtricitabine-tenofovir AF (BIKTARVY) 50-200-25 MG TABS tablet   Pitavastatin Magnesium (ZYPITAMAG) 4 MG TABS   Other Relevant Orders   HIV 1 RNA  quant-no reflex-bld   T-helper cells (CD4) count   RPR   Lipid panel   COMPLETE METABOLIC PANEL WITH GFR   CBC   Essential hypertension, benign - Primary    Quite hypertensive today. Took meds before he arrived here today. He is asymptomatic.  He thinks it is d/t heavy alcohol intake this weekend. He does have an ability to check at home - we went over how to do this and to message me if persistently > 140/90 - will add amlodipine 5 mg daily and increase atenolol to 100 mg daily.  Will see if we can get him into [facility] for assistance in management as well.       Relevant Medications   atenolol (TENORMIN) 50 MG tablet   Pitavastatin Magnesium (ZYPITAMAG) 4 MG TABS   Other Visit Diagnoses     HTN (hypertension)       Relevant Medications   atenolol (TENORMIN) 50 MG tablet   Pitavastatin Magnesium (ZYPITAMAG) 4 MG TABS       Rexene Alberts, MSN, NP-C [facility]  [email or web address] Pager: [phone] Office: [phone] [facility] Main Line: [phone]

## 2023-06-20 ENCOUNTER — Telehealth: Payer: Self-pay

## 2023-06-20 ENCOUNTER — Ambulatory Visit: Payer: Self-pay | Admitting: Infectious Diseases

## 2023-06-20 NOTE — Telephone Encounter (Signed)
Called patient regarding missed appointment today. Not able to reach him at this time. Left voicemail requesting call back. Juanita Laster, RMA

## 2023-08-14 ENCOUNTER — Ambulatory Visit: Payer: Self-pay | Admitting: Dermatology

## 2023-08-26 ENCOUNTER — Ambulatory Visit: Payer: Self-pay | Admitting: Dermatology

## 2023-08-29 ENCOUNTER — Other Ambulatory Visit: Payer: Self-pay

## 2023-08-29 ENCOUNTER — Ambulatory Visit: Payer: Medicaid Other

## 2023-08-29 ENCOUNTER — Encounter: Payer: Self-pay | Admitting: Internal Medicine

## 2023-08-29 ENCOUNTER — Ambulatory Visit (INDEPENDENT_AMBULATORY_CARE_PROVIDER_SITE_OTHER): Payer: Medicaid Other | Admitting: Internal Medicine

## 2023-08-29 VITALS — BP 139/84 | HR 84 | Resp 16 | Ht 74.0 in | Wt 217.0 lb

## 2023-08-29 DIAGNOSIS — I159 Secondary hypertension, unspecified: Secondary | ICD-10-CM

## 2023-08-29 DIAGNOSIS — B20 Human immunodeficiency virus [HIV] disease: Secondary | ICD-10-CM | POA: Diagnosis not present

## 2023-08-29 DIAGNOSIS — Z113 Encounter for screening for infections with a predominantly sexual mode of transmission: Secondary | ICD-10-CM

## 2023-08-29 DIAGNOSIS — Z5320 Procedure and treatment not carried out because of patient's decision for unspecified reasons: Secondary | ICD-10-CM | POA: Diagnosis not present

## 2023-08-29 DIAGNOSIS — F17210 Nicotine dependence, cigarettes, uncomplicated: Secondary | ICD-10-CM

## 2023-08-29 DIAGNOSIS — I10 Essential (primary) hypertension: Secondary | ICD-10-CM

## 2023-08-29 DIAGNOSIS — Z1159 Encounter for screening for other viral diseases: Secondary | ICD-10-CM | POA: Diagnosis not present

## 2023-08-29 MED ORDER — ATENOLOL 50 MG PO TABS: 50.0000 mg | ORAL_TABLET | Freq: Every day | ORAL | 11 refills | Status: DC

## 2023-08-29 NOTE — Patient Instructions (Signed)
Continue your current medication  Please discuss anal pap smear with Judeth Cornfield next visit in 6 months  I have refilled your atenolol

## 2023-08-29 NOTE — Progress Notes (Signed)
Subjective:    Patient ID: Gregory Whitney, male    DOB: 08-Feb-1976, 48 y.o.   MRN: [id number]  HPI:  Gregory Whitney is a 48 y.o. 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  Transmission: sexual/msm   Previous Regimens:  Atripla  Pharmacist, community Complaint  Patient presents with   Follow-up      HPI:  Patient previously saw Gregory Whitney Reviewed her last chart 02/2023 Patient on biktarvy/well controlled  08/29/23 id clinic visit Reviewed meds Continues on atenolol everyday with pitavastatin.  Compliant with biktarvy  Social -- Last sexual encounter 8 to 9 months ago; hasn't been in relationship since last year. Never had anal cancer screening.   He doesn't want to do anal pap today   Doing well with health   ROS: All other ros negative unless mentioned above    Objective:     Vitals:   08/29/23 1024  BP: 139/84  Pulse: 84  Resp: 16    Body mass index is 27.86 kg/m.    Physical Exam Vitals reviewed.  Constitutional:      Appearance: He is well-developed.     Comments: Seated comfortably in chair during visit.   HENT:     Mouth/Throat:     Dentition: Normal dentition. No dental abscesses.  Cardiovascular:     Rate and Rhythm: Normal rate and regular rhythm.     Heart sounds: Normal heart sounds.  Pulmonary:     Effort: Pulmonary effort is normal.     Breath sounds: Normal breath sounds.  Abdominal:     General: There is no distension.     Palpations: Abdomen is soft.     Tenderness: There is no abdominal tenderness.  Lymphadenopathy:     Cervical: No cervical adenopathy.  Skin:    General: Skin is warm and dry.     Findings: No rash.  Neurological:     Mental Status: He is alert and oriented to person, place, and time.  Psychiatric:        Judgment: Judgment normal.     Comments: In good spirits today and engaged in care discussion.         Assessment & Plan:   Problem List Items Addressed This Visit   None Visit Diagnoses        HIV disease (HCC)    -  Primary   Relevant Orders   HIV 1 RNA quant-no reflex-bld   T-helper cells (CD4) count   CBC   COMPLETE METABOLIC PANEL WITH GFR     Screening for STDs (sexually transmitted diseases)       Relevant Orders   Urine cytology ancillary only([place])   Cytology (oral, anal, urethral) ancillary only   Cytology (oral, anal, urethral) ancillary only   RPR     Need for hepatitis B screening test       Relevant Orders   Hepatitis B surface antibody,quantitative   Hepatitis, Acute     Screening for hepatitis C declined       Relevant Orders   Hepatitis, Acute       #hiv #reprieve  Doing well    -discussed u=u -encourage compliance -continue current HIV medication biktarvy -continue statin for CV risk reduction -labs today -f/u in 6 months with Gregory Whitney    #htn Renew atenolol   #std screening Triple screen/rpr   #anal cancer He defers today till next visit   #hcm Vaccine uptodate Hep b/c serology  rechecked -- will consider heplisav if still nonresponder Tb screen next visit Cancer screening as above

## 2023-08-30 LAB — URINE CYTOLOGY ANCILLARY ONLY
Chlamydia: NEGATIVE
Comment: NEGATIVE
Comment: NEGATIVE
Comment: NORMAL
Neisseria Gonorrhea: NEGATIVE
Trichomonas: NEGATIVE

## 2023-08-30 LAB — T-HELPER CELLS (CD4) COUNT (NOT AT ARMC)
CD4 % Helper T Cell: 37 % (ref 33–65)
CD4 T Cell Abs: 686 /uL (ref 400–1790)

## 2023-08-30 LAB — CYTOLOGY, (ORAL, ANAL, URETHRAL) ANCILLARY ONLY
Chlamydia: NEGATIVE
Chlamydia: NEGATIVE
Comment: NEGATIVE
Comment: NEGATIVE
Comment: NORMAL
Comment: NORMAL
Neisseria Gonorrhea: NEGATIVE
Neisseria Gonorrhea: NEGATIVE

## 2023-09-02 LAB — CBC
HCT: 45.3 % (ref 38.5–50.0)
Hemoglobin: 15.1 g/dL (ref 13.2–17.1)
MCH: 28.8 pg (ref 27.0–33.0)
MCHC: 33.3 g/dL (ref 32.0–36.0)
MCV: 86.5 fL (ref 80.0–100.0)
MPV: 10.5 fL (ref 7.5–12.5)
Platelets: 316 10*3/uL (ref 140–400)
RBC: 5.24 10*6/uL (ref 4.20–5.80)
RDW: 12.8 % (ref 11.0–15.0)
WBC: 3.8 10*3/uL (ref 3.8–10.8)

## 2023-09-02 LAB — COMPLETE METABOLIC PANEL WITH GFR
AG Ratio: 1.4 (calc) (ref 1.0–2.5)
ALT: 20 U/L (ref 9–46)
AST: 18 U/L (ref 10–40)
Albumin: 4.9 g/dL (ref 3.6–5.1)
Alkaline phosphatase (APISO): 83 U/L (ref 36–130)
BUN: 13 mg/dL (ref 7–25)
CO2: 21 mmol/L (ref 20–32)
Calcium: 9.2 mg/dL (ref 8.6–10.3)
Chloride: 104 mmol/L (ref 98–110)
Creat: 1.14 mg/dL (ref 0.60–1.29)
Globulin: 3.4 g/dL (ref 1.9–3.7)
Glucose, Bld: 81 mg/dL (ref 65–99)
Potassium: 3.8 mmol/L (ref 3.5–5.3)
Sodium: 140 mmol/L (ref 135–146)
Total Bilirubin: 0.5 mg/dL (ref 0.2–1.2)
Total Protein: 8.3 g/dL — ABNORMAL HIGH (ref 6.1–8.1)
eGFR: 79 mL/min/{1.73_m2} (ref >=60)

## 2023-09-02 LAB — HEPATITIS PANEL, ACUTE
Hep A IgM: NONREACTIVE
Hepatitis B Surface Ag: NONREACTIVE
Hepatitis C Ab: NONREACTIVE
Hepatitis C Ab: NONREACTIVE

## 2023-09-02 LAB — RPR: RPR Ser Ql: NONREACTIVE

## 2023-09-02 LAB — HIV-1 RNA QUANT-NO REFLEX-BLD
HIV 1 RNA Quant: 77 {copies}/mL — ABNORMAL HIGH
HIV-1 RNA Quant, Log: 1.89 {Log} — ABNORMAL HIGH

## 2023-09-02 LAB — HEPATITIS B SURFACE ANTIBODY, QUANTITATIVE: Hep B S AB Quant (Post): <5 m[IU]/mL — ABNORMAL LOW (ref >=10)

## 2023-10-01 ENCOUNTER — Other Ambulatory Visit: Payer: Self-pay

## 2023-10-01 MED ORDER — ROSUVASTATIN CALCIUM 10 MG PO TABS: 10.0000 mg | ORAL_TABLET | Freq: Every day | ORAL | 5 refills | Status: AC

## 2023-10-15 ENCOUNTER — Other Ambulatory Visit: Payer: Self-pay | Admitting: Infectious Diseases

## 2023-10-15 DIAGNOSIS — B20 Human immunodeficiency virus [HIV] disease: Secondary | ICD-10-CM

## 2024-02-17 ENCOUNTER — Ambulatory Visit: Payer: Self-pay

## 2024-02-17 NOTE — Telephone Encounter (Signed)
 FYI Only or Action Required?: FYI only for provider.  Patient was last seen in primary care on n/a not established.  Called Nurse Triage reporting Leg Swelling.  Symptoms began a week ago.  Interventions attempted: Nothing.  Symptoms are: stable.  Triage Disposition: See Physician Within 24 Hours  Patient/caregiver understands and will follow disposition?: Yes  Pt states swelling in RLE started last Monday, has gotten white scaly dry patches, denies itching or pain. Feels pressure in RLE as well. Doesn't have PCP just follows ID for HIV. Offered [facility] or UC, pt preferred UC and scheduled appt tomorrow 0830.   Copied from CRM [id number]. Topic: Clinical - Red Word Triage >> Feb 17, 2024  8:38 AM Treva T wrote: Red Word that prompted transfer to Nurse Triage: Patient calling states he is having swelling in right leg, white scaly patches, feels increased pressure also in that leg. Reason for Disposition  [1] MODERATE leg swelling (e.g., swelling extends up to knees) AND [2] new-onset or getting worse  Answer Assessment - Initial Assessment Questions 1. ONSET: When did the swelling start? (e.g., minutes, hours, days)     Monday last week  2. LOCATION: What part of the leg is swollen?  Are both legs swollen or just one leg?     R leg from thigh to foot  3. SEVERITY: How bad is the swelling? (e.g., localized; mild, moderate, severe)     Mild to moderate  4. REDNESS: Is there redness or signs of infection?     no 5. PAIN: Is the swelling painful to touch? If Yes, ask: How painful is it?   (Scale 1-10; mild, moderate or severe)     Not pain just more pressure 7. CAUSE: What do you think is causing the leg swelling?     Unsure never had swelling before  8. MEDICAL HISTORY: Do you have a history of blood clots (e.g., DVT), cancer, heart failure, kidney disease, or liver failure?     no 10. OTHER SYMPTOMS: Do you have any other symptoms? (e.g., chest pain, difficulty  breathing)       White scaly patches and increased pressure  Protocols used: Leg Swelling and Edema-A-AH

## 2024-02-18 ENCOUNTER — Emergency Department (HOSPITAL_COMMUNITY)

## 2024-02-18 ENCOUNTER — Ambulatory Visit (HOSPITAL_COMMUNITY)
Admission: RE | Admit: 2024-02-18 | Discharge: 2024-02-18 | Disposition: A | Discharge status: Home / Self Care | Payer: Self-pay | Source: Ambulatory Visit

## 2024-02-18 ENCOUNTER — Other Ambulatory Visit: Payer: Self-pay

## 2024-02-18 ENCOUNTER — Encounter (HOSPITAL_COMMUNITY): Payer: Self-pay

## 2024-02-18 ENCOUNTER — Observation Stay (HOSPITAL_COMMUNITY)
Admission: EM | Admit: 2024-02-18 | Discharge: 2024-02-19 | Disposition: A | Discharge status: Home / Self Care | Source: Ambulatory Visit | Attending: Family Medicine | Admitting: Family Medicine

## 2024-02-18 VITALS — BP 158/99 | HR 60 | Temp 98.2°F | Resp 18

## 2024-02-18 DIAGNOSIS — M7989 Other specified soft tissue disorders: Secondary | ICD-10-CM

## 2024-02-18 DIAGNOSIS — G4700 Insomnia, unspecified: Secondary | ICD-10-CM

## 2024-02-18 DIAGNOSIS — F10920 Alcohol use, unspecified with intoxication, uncomplicated: Secondary | ICD-10-CM | POA: Diagnosis not present

## 2024-02-18 DIAGNOSIS — R590 Localized enlarged lymph nodes: Secondary | ICD-10-CM | POA: Diagnosis not present

## 2024-02-18 DIAGNOSIS — Z21 Asymptomatic human immunodeficiency virus [HIV] infection status: Secondary | ICD-10-CM | POA: Diagnosis not present

## 2024-02-18 DIAGNOSIS — R2240 Localized swelling, mass and lump, unspecified lower limb: Secondary | ICD-10-CM | POA: Diagnosis not present

## 2024-02-18 DIAGNOSIS — K429 Umbilical hernia without obstruction or gangrene: Secondary | ICD-10-CM | POA: Diagnosis not present

## 2024-02-18 DIAGNOSIS — L03115 Cellulitis of right lower limb: Secondary | ICD-10-CM | POA: Diagnosis not present

## 2024-02-18 DIAGNOSIS — R2241 Localized swelling, mass and lump, right lower limb: Secondary | ICD-10-CM | POA: Diagnosis present

## 2024-02-18 DIAGNOSIS — I10 Essential (primary) hypertension: Secondary | ICD-10-CM | POA: Diagnosis not present

## 2024-02-18 DIAGNOSIS — F12920 Cannabis use, unspecified with intoxication, uncomplicated: Secondary | ICD-10-CM | POA: Insufficient documentation

## 2024-02-18 DIAGNOSIS — L732 Hidradenitis suppurativa: Secondary | ICD-10-CM | POA: Diagnosis not present

## 2024-02-18 DIAGNOSIS — B20 Human immunodeficiency virus [HIV] disease: Secondary | ICD-10-CM | POA: Diagnosis not present

## 2024-02-18 LAB — CBC WITH DIFFERENTIAL/PLATELET
Abs Immature Granulocytes: 0.04 K/uL (ref 0.00–0.07)
Basophils Absolute: 0 K/uL (ref 0.0–0.1)
Basophils Relative: 0 %
Eosinophils Absolute: 0.2 K/uL (ref 0.0–0.5)
Eosinophils Relative: 2 %
HCT: 36.7 % — ABNORMAL LOW (ref 39.0–52.0)
Hemoglobin: 11.4 g/dL — ABNORMAL LOW (ref 13.0–17.0)
Immature Granulocytes: 1 %
Lymphocytes Relative: 29 %
Lymphs Abs: 1.9 K/uL (ref 0.7–4.0)
MCH: 28.5 pg (ref 26.0–34.0)
MCHC: 31.1 g/dL (ref 30.0–36.0)
MCV: 91.8 fL (ref 80.0–100.0)
Monocytes Absolute: 0.5 K/uL (ref 0.1–1.0)
Monocytes Relative: 8 %
Neutro Abs: 4 K/uL (ref 1.7–7.7)
Neutrophils Relative %: 60 %
Platelets: 420 K/uL — ABNORMAL HIGH (ref 150–400)
RBC: 4 MIL/uL — ABNORMAL LOW (ref 4.22–5.81)
RDW: 14.6 % (ref 11.5–15.5)
WBC: 6.7 K/uL (ref 4.0–10.5)
nRBC: 0 % (ref 0.0–0.2)

## 2024-02-18 LAB — COMPREHENSIVE METABOLIC PANEL WITH GFR
ALT: 13 U/L (ref 0–44)
AST: 13 U/L — ABNORMAL LOW (ref 15–41)
Albumin: 3 g/dL — ABNORMAL LOW (ref 3.5–5.0)
Alkaline Phosphatase: 48 U/L (ref 38–126)
Anion gap: 10 (ref 5–15)
BUN: 13 mg/dL (ref 6–20)
CO2: 25 mmol/L (ref 22–32)
Calcium: 8.3 mg/dL — ABNORMAL LOW (ref 8.9–10.3)
Chloride: 104 mmol/L (ref 98–111)
Creatinine, Ser: 0.99 mg/dL (ref 0.61–1.24)
GFR, Estimated: >60 mL/min (ref >60)
Glucose, Bld: 90 mg/dL (ref 70–99)
Potassium: 3.5 mmol/L (ref 3.5–5.1)
Sodium: 139 mmol/L (ref 135–145)
Total Bilirubin: 0.5 mg/dL (ref 0.0–1.2)
Total Protein: 7.2 g/dL (ref 6.5–8.1)

## 2024-02-18 LAB — I-STAT CHEM 8, ED
BUN: 12 mg/dL (ref 6–20)
Calcium, Ion: 1.03 mmol/L — ABNORMAL LOW (ref 1.15–1.40)
Chloride: 105 mmol/L (ref 98–111)
Creatinine, Ser: 1.1 mg/dL (ref 0.61–1.24)
Glucose, Bld: 85 mg/dL (ref 70–99)
HCT: 36 % — ABNORMAL LOW (ref 39.0–52.0)
Hemoglobin: 12.2 g/dL — ABNORMAL LOW (ref 13.0–17.0)
Potassium: 3.4 mmol/L — ABNORMAL LOW (ref 3.5–5.1)
Sodium: 140 mmol/L (ref 135–145)
TCO2: 23 mmol/L (ref 22–32)

## 2024-02-18 LAB — C-REACTIVE PROTEIN: CRP: 7.4 mg/dL — ABNORMAL HIGH (ref <1.0)

## 2024-02-18 LAB — MAGNESIUM: Magnesium: 2.1 mg/dL (ref 1.7–2.4)

## 2024-02-18 LAB — I-STAT CG4 LACTIC ACID, ED: Lactic Acid, Venous: 1 mmol/L (ref 0.5–1.9)

## 2024-02-18 LAB — PROTIME-INR
INR: 1 (ref 0.8–1.2)
Prothrombin Time: 13.6 s (ref 11.4–15.2)

## 2024-02-18 LAB — CK: Total CK: 65 U/L (ref 49–397)

## 2024-02-18 LAB — SEDIMENTATION RATE: Sed Rate: 54 mm/h — ABNORMAL HIGH (ref 0–16)

## 2024-02-18 MED ORDER — CLINDAMYCIN PHOSPHATE 900 MG/50ML IV SOLN
900.0000 mg | Freq: Once | INTRAVENOUS | Status: AC
  Administered 2024-02-18: 900 mg via INTRAVENOUS
  Filled 2024-02-18: qty 50

## 2024-02-18 MED ORDER — ONDANSETRON HCL 4 MG/2ML IJ SOLN: 4.0000 mg | Freq: Four times a day (QID) | INTRAMUSCULAR | Status: DC | PRN

## 2024-02-18 MED ORDER — OXYCODONE-ACETAMINOPHEN 5-325 MG PO TABS
1.0000 | ORAL_TABLET | Freq: Once | ORAL | Status: AC
  Administered 2024-02-18: 1 via ORAL
  Filled 2024-02-18: qty 1

## 2024-02-18 MED ORDER — VANCOMYCIN HCL 2000 MG/400ML IV SOLN
2000.0000 mg | Freq: Once | INTRAVENOUS | Status: AC
  Administered 2024-02-18: 2000 mg via INTRAVENOUS
  Filled 2024-02-18: qty 400

## 2024-02-18 MED ORDER — HYDROXYZINE HCL 25 MG PO TABS
25.0000 mg | ORAL_TABLET | Freq: Once | ORAL | Status: AC
  Administered 2024-02-18: 25 mg via ORAL
  Filled 2024-02-18: qty 1

## 2024-02-18 MED ORDER — PIPERACILLIN-TAZOBACTAM 3.375 G IVPB 30 MIN
3.3750 g | Freq: Once | INTRAVENOUS | Status: AC
  Administered 2024-02-18: 3.375 g via INTRAVENOUS
  Filled 2024-02-18: qty 50

## 2024-02-18 MED ORDER — IOHEXOL 300 MG/ML  SOLN
100.0000 mL | Freq: Once | INTRAMUSCULAR | Status: AC | PRN
  Administered 2024-02-18: 100 mL via INTRAVENOUS

## 2024-02-18 MED ORDER — CEFAZOLIN SODIUM-DEXTROSE 2-4 GM/100ML-% IV SOLN
2.0000 g | Freq: Three times a day (TID) | INTRAVENOUS | Status: DC
  Administered 2024-02-19 (×2): 2 g via INTRAVENOUS
  Filled 2024-02-18 (×2): qty 100

## 2024-02-18 MED ORDER — CEPHALEXIN 500 MG PO CAPS
500.0000 mg | ORAL_CAPSULE | Freq: Once | ORAL | Status: AC
  Administered 2024-02-18: 500 mg via ORAL
  Filled 2024-02-18: qty 1

## 2024-02-18 MED ORDER — MELATONIN 3 MG PO TABS
3.0000 mg | ORAL_TABLET | Freq: Every day | ORAL | Status: DC
  Administered 2024-02-18: 3 mg via ORAL
  Filled 2024-02-18: qty 1

## 2024-02-18 MED ORDER — OXYCODONE-ACETAMINOPHEN 5-325 MG PO TABS: 1.0000 | ORAL_TABLET | Freq: Four times a day (QID) | ORAL | Status: DC | PRN

## 2024-02-18 MED ORDER — SENNOSIDES-DOCUSATE SODIUM 8.6-50 MG PO TABS: 1.0000 | ORAL_TABLET | Freq: Every evening | ORAL | Status: DC | PRN

## 2024-02-18 MED ORDER — ONDANSETRON HCL 4 MG PO TABS: 4.0000 mg | ORAL_TABLET | Freq: Four times a day (QID) | ORAL | Status: DC | PRN

## 2024-02-18 MED ORDER — ENOXAPARIN SODIUM 40 MG/0.4ML IJ SOSY
40.0000 mg | PREFILLED_SYRINGE | INTRAMUSCULAR | Status: DC
  Administered 2024-02-18: 40 mg via SUBCUTANEOUS
  Filled 2024-02-18: qty 0.4

## 2024-02-18 MED ORDER — BICTEGRAVIR-EMTRICITAB-TENOFOV 50-200-25 MG PO TABS
1.0000 | ORAL_TABLET | Freq: Every day | ORAL | Status: DC
  Administered 2024-02-19: 1 via ORAL
  Filled 2024-02-18: qty 1

## 2024-02-18 MED ORDER — ATENOLOL 25 MG PO TABS
50.0000 mg | ORAL_TABLET | Freq: Every day | ORAL | Status: DC
  Administered 2024-02-19: 50 mg via ORAL
  Filled 2024-02-18: qty 2

## 2024-02-18 MED ORDER — CEPHALEXIN 500 MG PO CAPS: 500.0000 mg | ORAL_CAPSULE | Freq: Four times a day (QID) | ORAL | 0 refills | Status: DC

## 2024-02-18 MED ORDER — ACETAMINOPHEN 500 MG PO TABS
1000.0000 mg | ORAL_TABLET | Freq: Three times a day (TID) | ORAL | Status: DC
  Administered 2024-02-18 – 2024-02-19 (×2): 1000 mg via ORAL
  Filled 2024-02-18 (×2): qty 2

## 2024-02-18 NOTE — Consult Note (Addendum)
 Surgical Evaluation Requesting provider: Dr. Rolan Quale  Chief Complaint: right lower extremity swelling  HPI: Very pleasant 48 year old man with history of HIV on Biktarvy , hypertension, alcohol use and longstanding history of perineal hidradenitis who presents with right lower extremity swelling and pain which has been going on for about a week and a half.  Pain is more prominent in the lower leg.  This is worsening over the last 24 hours.  Denies any fever, or other systemic symptoms. Denies perineal or groin pain.  He reports a longstanding history of perineal hidradenitis with previous debridement and has chronic drainage from this for which he wears absorbent underwear.  No Known Allergies  Past Medical History:  Diagnosis Date   Arthritis    Blood dyscrasia    hiv   Closed fracture of lateral portion of left tibial plateau with routine healing 06/10/2014   HIV (human immunodeficiency virus infection) (HCC)    Hypertension    no med at present-stopped(atenolol ) by self 8 months ago   Leg fracture    left    Past Surgical History:  Procedure Laterality Date   LEG SURGERY Right 2011   ?boil upper thigh   ORIF TIBIA PLATEAU Left 06/10/2014   Procedure: OPEN REDUCTION INTERNAL FIXATION (ORIF) TIBIAL PLATEAU;  Surgeon: Fonda SHAUNNA Olmsted, MD;  Location: [facility] OR;  Service: Orthopedics;  Laterality: Left;    Family History  Problem Relation Age of Onset   Hypertension Father     Social History   Socioeconomic History   Marital status: Single    Spouse name: Not on file   Number of children: Not on file   Years of education: Not on file   Highest education level: Not on file  Occupational History   Not on file  Tobacco Use   Smoking status: Every Day    Current packs/day: 1.00    Average packs/day: 1 pack/day for 15.0 years (15.0 ttl pk-yrs)    Types: Cigarettes   Smokeless tobacco: Never  Vaping Use   Vaping status: Never Used  Substance and Sexual Activity   Alcohol use:  Yes    Alcohol/week: 14.0 standard drinks of alcohol    Types: 14 Cans of beer per week    Comment: 2 40's a day   Drug use: Yes    Types: Marijuana    Comment: marijuana weekly   Sexual activity: Not Currently    Comment: pt. declined condoms  Other Topics Concern   Not on file  Social History Narrative   Not on file   Social Drivers of Health   Financial Resource Strain: Not on file  Food Insecurity: Not on file  Transportation Needs: Not on file  Physical Activity: Not on file  Stress: Not on file  Social Connections: Not on file    No current facility-administered medications on file prior to encounter.   Current Outpatient Medications on File Prior to Encounter  Medication Sig Dispense Refill   ADVIL PM 200-38 MG TABS Take 3 tablets by mouth at bedtime.     ALEVE 220 MG tablet Take 440 mg by mouth in the morning.     atenolol  (TENORMIN ) 50 MG tablet Take 1 tablet (50 mg total) by mouth daily. 30 tablet 11   bictegravir-emtricitabine -tenofovir  AF (BIKTARVY ) 50-200-25 MG TABS tablet TAKE 1 TABLET BY MOUTH DAILY. 30 tablet 11   rosuvastatin  (CRESTOR ) 10 MG tablet Take 1 tablet (10 mg total) by mouth daily. (Patient not taking: Reported on 02/18/2024) 30 tablet  5    Review of Systems: a complete, 10pt review of systems was completed with pertinent positives and negatives as documented in the HPI  Physical Exam: Vitals:   02/18/24 1431 02/18/24 1734  BP: (!) 169/114 (!) 165/108  Pulse: (!) 57 70  Resp: 20 20  Temp: 98.4 F (36.9 C) 98.2 F (36.8 C)  SpO2: 100% 90%   Gen: A&Ox3, no distress  Chest: respiratory effort is normal.  Cardiovascular: RRR Muscoloskeletal: no clubbing or cyanosis of the fingers.  Strength is symmetrical throughout.  Edema and erythema most prominent along the right lower leg.  This is mildly tender.  No significant tenderness along the proximal thigh or groin.  In the right proximal thigh/buttock region and perineal region is several areas of  chronic thin purulent appearing drainage and sinus tracts consistent with known history of hidradenitis.  No significant tenderness, no crepitus, no erythema or warmth. Neuro: No gross deficit Psych: appropriate mood and affect, normal insight/judgment intact  Skin: warm and dry      Latest Ref Rng & Units 02/18/2024    5:21 PM 02/18/2024   10:12 AM 08/29/2023   10:53 AM  CBC  WBC 4.0 - 10.5 K/uL  6.7  3.8   Hemoglobin 13.0 - 17.0 g/dL 87.7  88.5  84.8   Hematocrit 39.0 - 52.0 % 36.0  36.7  45.3   Platelets 150 - 400 K/uL  420  316        Latest Ref Rng & Units 02/18/2024    5:21 PM 02/18/2024    5:15 PM 08/29/2023   10:53 AM  CMP  Glucose 70 - 99 mg/dL 85  90  81   BUN 6 - 20 mg/dL 12  13  13    Creatinine 0.61 - 1.24 mg/dL 8.89  9.00  8.85   Sodium 135 - 145 mmol/L 140  139  140   Potassium 3.5 - 5.1 mmol/L 3.4  3.5  3.8   Chloride 98 - 111 mmol/L 105  104  104   CO2 22 - 32 mmol/L  25  21   Calcium  8.9 - 10.3 mg/dL  8.3  9.2   Total Protein 6.5 - 8.1 g/dL  7.2  8.3   Total Bilirubin 0.0 - 1.2 mg/dL  0.5  0.5   Alkaline Phos 38 - 126 U/L  48    AST 15 - 41 U/L  13  18   ALT 0 - 44 U/L  13  20     Lab Results  Component Value Date   INR 1.0 02/18/2024    Imaging: CT PELVIS W CONTRAST Result Date: 02/18/2024 CLINICAL DATA:  Right lower extremity swelling for 1 week. EXAM: CT PELVIS WITH CONTRAST TECHNIQUE: Multidetector CT imaging of the pelvis was performed using the standard protocol following the bolus administration of intravenous contrast. RADIATION DOSE REDUCTION: This exam was performed according to the departmental dose-optimization program which includes automated exposure control, adjustment of the mA and/or kV according to patient size and/or use of iterative reconstruction technique. CONTRAST:  OMNIPAQUE  IOHEXOL  300 MG/ML  SOLN COMPARISON:  None Available. FINDINGS: Urinary Tract:  Urinary bladder is unremarkable. Bowel: Moderate wall thickening of distal  sigmoid colon and rectum is noted suggesting proctocolitis. Vascular/Lymphatic: No significant vascular abnormality is noted. Right inguinal adenopathy is noted with the largest lymph node measuring 2.9 x 2.1 cm. Left inguinal adenopathy is also noted with largest lymph node measuring 3.5 x 1.8 cm. Right external and internal  iliac adenopathy is also noted with the largest measuring 29 x 13 mm. Reproductive:  Prostate is unremarkable. Other: Small fat containing periumbilical hernia. No ascites. However, there is extensive thickening of the subcutaneous tissues of the right perineum and buttocks with gas suggesting necrotizing cellulitis. Musculoskeletal: No fracture or other significant osseous abnormality is noted. IMPRESSION: Extensive thickening of subcutaneous tissues of the right perineum and right buttocks posteriorly is noted which contains gas suggesting necrotizing cellulitis or possibly Fournier's gangrene. Enlarged bilateral inguinal adenopathy is noted as well as right external and internal iliac adenopathy is noted. While this may be inflammatory or infectious in etiology, malignancy cannot be excluded and tissue sampling is recommended. Moderate wall thickening of distal sigmoid colon and rectum is noted suggesting proctocolitis. Electronically Signed   By: Lynwood Landy Raddle M.D.   On: 02/18/2024 17:59   VAS US  LOWER EXTREMITY VENOUS (DVT) (ONLY [facility] & [facility]) Result Date: 02/18/2024  Lower Venous DVT Study Patient Name:  Gregory Whitney  Date of Exam:   02/18/2024 Medical Rec #: [id number]    Accession #:    [id number] Date of Birth: 1976-05-31    Patient Gender: M Patient Age:   68 years Exam Location:  [facility] Procedure:      VAS US  LOWER EXTREMITY VENOUS (DVT) Referring Phys: BERNARDINO FIREMAN --------------------------------------------------------------------------------  Indications: Swelling, and Edema.  Comparison Study: No prior Performing Technologist: Elmarie Lindau, RVT  Examination Guidelines:  A complete evaluation includes B-mode imaging, spectral Doppler, color Doppler, and power Doppler as needed of all accessible portions of each vessel. Bilateral testing is considered an integral part of a complete examination. Limited examinations for reoccurring indications may be performed as noted. The reflux portion of the exam is performed with the patient in reverse Trendelenburg.  +---------+---------------+---------+-----------+----------+--------------+ RIGHT    CompressibilityPhasicitySpontaneityPropertiesThrombus Aging +---------+---------------+---------+-----------+----------+--------------+ CFV      Full           Yes      Yes                                 +---------+---------------+---------+-----------+----------+--------------+ SFJ      Full                                                        +---------+---------------+---------+-----------+----------+--------------+ FV Prox  Full                                                        +---------+---------------+---------+-----------+----------+--------------+ FV Mid   Full                                                        +---------+---------------+---------+-----------+----------+--------------+ FV DistalFull                                                        +---------+---------------+---------+-----------+----------+--------------+  PFV      Full                                                        +---------+---------------+---------+-----------+----------+--------------+ POP      Full           Yes      Yes                                 +---------+---------------+---------+-----------+----------+--------------+ PTV      Full                                                        +---------+---------------+---------+-----------+----------+--------------+ PERO     Full                                                         +---------+---------------+---------+-----------+----------+--------------+ There is a heterogeneous, vascularized mass in the groin measuring 3.10 x 1.27 cm. There is a cluster of varicose veins at the medial proximal calf area that are partially compresssible.  +----+---------------+---------+-----------+----------+--------------+ LEFTCompressibilityPhasicitySpontaneityPropertiesThrombus Aging +----+---------------+---------+-----------+----------+--------------+ CFV Full           Yes      Yes                                 +----+---------------+---------+-----------+----------+--------------+ There is a heterogeneous, vascularized mass in the groin measuring 2.19 x 1.05 cm.   Summary: RIGHT: - There is no evidence of deep vein thrombosis in the lower extremity.  - No cystic structure found in the popliteal fossa. - There is a heterogeneous, vascularized mass in the groin measuring 3.10 x 1.27 cm. There is a cluster of varicose veins at the medial proximal calf area that have chronic appearing, partially compressible thrombus.  LEFT: - No evidence of common femoral vein obstruction.  - There is a heterogeneous, vascularized mass in the groin measuring 2.19 x 1.05 cm.  *See table(s) above for measurements and observations. Electronically signed by Debby Robertson on 02/18/2024 at 5:17:52 PM.    Final      A/P: 48 year old man with history of HIV, intermittent alcohol/tobacco abuse, and hidradenitis with prior debridement presents with right lower leg swelling. He is afebrile, no tachycardia, hypertensive; he has no leukocytosis, hyponatremia, acidosis or AKI.  I reviewed his CT images personally and the area of suspected subcutaneous air in the perineum and groin corresponds to sinus tracts from chronic hurley stage 3 hidradenitis on physical exam.  This does not require surgical intervention and I do not think that this is contributing to his presenting complaint.  Surgery team will follow and  recheck tomorrow.    Patient Active Problem List   Diagnosis Date Noted   Hidradenitis suppurativa of multiple sites 08/15/2022   Healthcare maintenance 02/14/2022   Stiffness of hand joint 01/30/2021   Alcohol use 01/30/2021   TOBACCO ABUSE  02/22/2010   Essential hypertension, benign 11/16/2009   HIV disease  09/02/2009       Mitzie Freund, MD [facility]  See AMION to contact appropriate on-call provider

## 2024-02-18 NOTE — H&P (Signed)
 History and Physical  Gregory Whitney FMW:[id number] DOB: Nov 21, 1975 DOA: 02/18/2024  PCP: Pcp, No   Chief Complaint: Right lower extremity swelling  HPI: Gregory Whitney is a 48 y.o. male with medical history significant for HIV on Biktarvy , HTN, HLD and hidradenitis suppurativa who presented to the ED for evaluation of right lower extremity swelling.  Patient reports he noticed increased swelling in his right leg last Monday but a few days before that he experienced some throbbing in that area. The swelling progressed throughout the week and over the weekend with associated redness and warmth in his right lower leg.  He experienced some pain in his right ankle when he ambulates so he has been taking Aleve and Tylenol  PM for the pain.  He denies any nausea, vomiting, abdominal pain, fever, chills, groin pain, dizziness, diarrhea, constipation or headache.  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   ED Course: Initial vitals show patient afebrile but hypertensive with SBP in the 150s to 160s. Initial labs significant for WBC 6.7, Hgb 11.4, platelet 420, normal kidney function, mag, lactic acid and CK.  CT pelvis changes in the right perineum and right buttocks posteriorly concerning for necrotizing cellulitis or possibly Fournier's gangrene. Pt received oral Keflex  500 mg x 1, hydroxyzine  25 mg x 1, Percocet 5/325 mg x 1, IV Zosyn , IV vancomycin  and IV clindamycin .  General surgery was consulted for evaluation. TRH was consulted for admission.   Review of Systems: Please see HPI for pertinent positives and negatives. A complete 10 system review of systems are otherwise negative.  Past Medical History:  Diagnosis Date   Arthritis    Blood dyscrasia    hiv   Closed fracture of lateral portion of left tibial plateau with routine healing 06/10/2014   HIV (human immunodeficiency virus infection) (HCC)    Hypertension    no  med at present-stopped(atenolol ) by self 8 months ago   Leg fracture    left   Past Surgical History:  Procedure Laterality Date   LEG SURGERY Right 2011   ?boil upper thigh   ORIF TIBIA PLATEAU Left 06/10/2014   Procedure: OPEN REDUCTION INTERNAL FIXATION (ORIF) TIBIAL PLATEAU;  Surgeon: Fonda SHAUNNA Olmsted, MD;  Location: [facility] OR;  Service: Orthopedics;  Laterality: Left;   Social History:  reports that he has been smoking cigarettes. He has a 15 pack-year smoking history. He has never used smokeless tobacco. He reports current alcohol use of about 14.0 standard drinks of alcohol per week. He reports current drug use. Drug: Marijuana.  No Known Allergies  Family History  Problem Relation Age of Onset   Hypertension Father      Prior to Admission medications   Medication Sig Start Date End Date Taking? Authorizing Provider  ADVIL PM 200-38 MG TABS Take 3 tablets by mouth at bedtime.   Yes [provider]  ALEVE 220 MG tablet Take 440 mg by mouth in the morning.   Yes [provider]  atenolol  (TENORMIN ) 50 MG tablet Take 1 tablet (50 mg total) by mouth daily. 08/29/23 08/23/24 Yes Vu, Constance DASEN, MD  bictegravir-emtricitabine -tenofovir  AF (BIKTARVY ) 50-200-25 MG TABS tablet TAKE 1 TABLET BY MOUTH DAILY. 03/04/23  Yes Melvenia Corean SAILOR, NP  cephALEXin  (KEFLEX ) 500 MG capsule Take 1 capsule (500 mg total) by mouth 4 (four) times daily for 5 days. 02/18/24 02/23/24 Yes Melvenia Motto, MD  rosuvastatin  (CRESTOR ) 10 MG tablet Take  1 tablet (10 mg total) by mouth daily. Patient not taking: Reported on 02/18/2024 10/01/23   Melvenia Corean SAILOR, NP    Physical Exam: BP (!) 165/108 (BP Location: Right Arm)   Pulse 70   Temp 98.2 F (36.8 C) (Oral)   Resp 20   Ht 6' 2 (1.88 m)   Wt 98.4 kg   SpO2 90%   BMI 27.86 kg/m  General: Pleasant, well-appearing middle-age man laying in bed. No acute distress. HEENT: [place]/AT. Anicteric sclera CV: RRR. No murmurs, rubs, or gallops.  Pulmonary:  Lungs CTAB. Normal effort. No wheezing or rales. Abdominal: Soft, nontender, nondistended. Normal bowel sounds. Extremities: 1-2+ RLE pitting edema up to right thigh.  Palpable pedal pulses. Skin: Warm and dry. Erythema, tenderness and warmth to the lower aspect of the right leg. (See media tab). Right groin with chronic drainage and small sinus tracts from known HS Neuro: A&Ox3. Moves all extremities. Normal sensation to light touch. No focal deficit. Psych: Normal mood and affect          Labs on Admission:  Basic Metabolic Panel: Recent Labs  Lab 02/18/24 1715 02/18/24 1721  NA 139 140  K 3.5 3.4*  CL 104 105  CO2 25  --   GLUCOSE 90 85  BUN 13 12  CREATININE 0.99 1.10  CALCIUM  8.3*  --   MG 2.1  --    Liver Function Tests: Recent Labs  Lab 02/18/24 1715  AST 13*  ALT 13  ALKPHOS 48  BILITOT 0.5  PROT 7.2  ALBUMIN 3.0*   No results for input(s): LIPASE, AMYLASE in the last 168 hours. No results for input(s): AMMONIA in the last 168 hours. CBC: Recent Labs  Lab 02/18/24 1012 02/18/24 1721  WBC 6.7  --   NEUTROABS 4.0  --   HGB 11.4* 12.2*  HCT 36.7* 36.0*  MCV 91.8  --   PLT 420*  --    Cardiac Enzymes: Recent Labs  Lab 02/18/24 1715  CKTOTAL 65   BNP (last 3 results) No results for input(s): BNP in the last 8760 hours.  ProBNP (last 3 results) No results for input(s): PROBNP in the last 8760 hours.  CBG: No results for input(s): GLUCAP in the last 168 hours.  Radiological Exams on Admission: CT PELVIS W CONTRAST Result Date: 02/18/2024 CLINICAL DATA:  Right lower extremity swelling for 1 week. EXAM: CT PELVIS WITH CONTRAST TECHNIQUE: Multidetector CT imaging of the pelvis was performed using the standard protocol following the bolus administration of intravenous contrast. RADIATION DOSE REDUCTION: This exam was performed according to the departmental dose-optimization program which includes automated exposure control, adjustment of the  mA and/or kV according to patient size and/or use of iterative reconstruction technique. CONTRAST:  OMNIPAQUE  IOHEXOL  300 MG/ML  SOLN COMPARISON:  None Available. FINDINGS: Urinary Tract:  Urinary bladder is unremarkable. Bowel: Moderate wall thickening of distal sigmoid colon and rectum is noted suggesting proctocolitis. Vascular/Lymphatic: No significant vascular abnormality is noted. Right inguinal adenopathy is noted with the largest lymph node measuring 2.9 x 2.1 cm. Left inguinal adenopathy is also noted with largest lymph node measuring 3.5 x 1.8 cm. Right external and internal iliac adenopathy is also noted with the largest measuring 29 x 13 mm. Reproductive:  Prostate is unremarkable. Other: Small fat containing periumbilical hernia. No ascites. However, there is extensive thickening of the subcutaneous tissues of the right perineum and buttocks with gas suggesting necrotizing cellulitis. Musculoskeletal: No fracture or other significant osseous abnormality  is noted. IMPRESSION: Extensive thickening of subcutaneous tissues of the right perineum and right buttocks posteriorly is noted which contains gas suggesting necrotizing cellulitis or possibly Fournier's gangrene. Enlarged bilateral inguinal adenopathy is noted as well as right external and internal iliac adenopathy is noted. While this may be inflammatory or infectious in etiology, malignancy cannot be excluded and tissue sampling is recommended. Moderate wall thickening of distal sigmoid colon and rectum is noted suggesting proctocolitis. Electronically Signed   By: Lynwood Landy Raddle M.D.   On: 02/18/2024 17:59   VAS US  LOWER EXTREMITY VENOUS (DVT) (ONLY [facility]) Result Date: 02/18/2024  Lower Venous DVT Study Patient Name:  CHARLIS HARNER  Date of Exam:   02/18/2024 Medical Rec #: [id number]    Accession #:    [id number] Date of Birth: 10/10/1975    Patient Gender: M Patient Age:   43 years Exam Location:  [facility] Procedure:      VAS US   LOWER EXTREMITY VENOUS (DVT) Referring Phys: BERNARDINO FIREMAN --------------------------------------------------------------------------------  Indications: Swelling, and Edema.  Comparison Study: No prior Performing Technologist: Elmarie Lindau, RVT  Examination Guidelines: A complete evaluation includes B-mode imaging, spectral Doppler, color Doppler, and power Doppler as needed of all accessible portions of each vessel. Bilateral testing is considered an integral part of a complete examination. Limited examinations for reoccurring indications may be performed as noted. The reflux portion of the exam is performed with the patient in reverse Trendelenburg.  +---------+---------------+---------+-----------+----------+--------------+ RIGHT    CompressibilityPhasicitySpontaneityPropertiesThrombus Aging +---------+---------------+---------+-----------+----------+--------------+ CFV      Full           Yes      Yes                                 +---------+---------------+---------+-----------+----------+--------------+ SFJ      Full                                                        +---------+---------------+---------+-----------+----------+--------------+ FV Prox  Full                                                        +---------+---------------+---------+-----------+----------+--------------+ FV Mid   Full                                                        +---------+---------------+---------+-----------+----------+--------------+ FV DistalFull                                                        +---------+---------------+---------+-----------+----------+--------------+ PFV      Full                                                        +---------+---------------+---------+-----------+----------+--------------+  POP      Full           Yes      Yes                                  +---------+---------------+---------+-----------+----------+--------------+ PTV      Full                                                        +---------+---------------+---------+-----------+----------+--------------+ PERO     Full                                                        +---------+---------------+---------+-----------+----------+--------------+ There is a heterogeneous, vascularized mass in the groin measuring 3.10 x 1.27 cm. There is a cluster of varicose veins at the medial proximal calf area that are partially compresssible.  +----+---------------+---------+-----------+----------+--------------+ LEFTCompressibilityPhasicitySpontaneityPropertiesThrombus Aging +----+---------------+---------+-----------+----------+--------------+ CFV Full           Yes      Yes                                 +----+---------------+---------+-----------+----------+--------------+ There is a heterogeneous, vascularized mass in the groin measuring 2.19 x 1.05 cm.   Summary: RIGHT: - There is no evidence of deep vein thrombosis in the lower extremity.  - No cystic structure found in the popliteal fossa. - There is a heterogeneous, vascularized mass in the groin measuring 3.10 x 1.27 cm. There is a cluster of varicose veins at the medial proximal calf area that have chronic appearing, partially compressible thrombus.  LEFT: - No evidence of common femoral vein obstruction.  - There is a heterogeneous, vascularized mass in the groin measuring 2.19 x 1.05 cm.  *See table(s) above for measurements and observations. Electronically signed by Debby Robertson on 02/18/2024 at 5:17:52 PM.    Final    Assessment/Plan Sundiata Ferrick is a 48 y.o. male with medical history significant for HIV on Biktarvy , HTN, HLD and hidradenitis suppurativa who presented to the ED for evaluation of right lower extremity swelling and admitted for cellulitis of the right lower extremity.  # Cellulitis of right lower  extremity # Right lower extremity swelling - Pt w/ hx of hidradenitis suppurativa presenting with over 1 week of worsening right lower extremity swelling - Right leg has become more tender and erythematous over the last few days but no systemic signs of infection - U/S of RLE negative for DVT but shows a vascularized 2 x 1 cm mass in the R groin - CT of the pelvis shows some gas in the right perineum and right buttocks concerning for necrotizing cellulitis or possibly Fournier's gangrene - General Surgery consulted, reviewed CT scan and believes the findings correspond to patient's sinus tracts from chronic [place] stage III hidradenitis.  - Patient afebrile, normotensive with normal white count and lactic acid - Has mild right lower leg tenderness but nontoxic appearing on exam - Start IV Ancef  - Apply TED hose and elevate feet - Trend CBC, fever curve  # HIV -  Continue Biktarvy  - Follow-up with ID as scheduled on 7/25  # HTN - BP elevated with SBP in the 150s to 180s - Continue atenolol   # Insomnia - Requesting something to help with sleep - Start melatonin at bedtime  DVT prophylaxis: Lovenox      Code Status: Full Code  Consults called: General Surgery  Family Communication: No family at bedside  Severity of Illness: The appropriate patient status for this patient is OBSERVATION. Observation status is judged to be reasonable and necessary in order to provide the required intensity of service to ensure the patient's safety. The patient's presenting symptoms, physical exam findings, and initial radiographic and laboratory data in the context of their medical condition is felt to place them at decreased risk for further clinical deterioration. Furthermore, it is anticipated that the patient will be medically stable for discharge from the hospital within 2 midnights of admission.   Level of care: Telemetry   This record has been created using Conservation officer, historic buildings.  Errors have been sought and corrected, but may not always be located. Such creation errors do not reflect on the standard of care.   Lou Claretta HERO, MD 02/18/2024, 9:36 PM [facility] Pager: [phone] Isaiah 41:10   If 7PM-7AM, please contact night-coverage [email or web address] Password [email or web address]

## 2024-02-18 NOTE — ED Triage Notes (Signed)
 Pt c/o rt leg swelling since last Monday after sleeping on the floor. C/o pressure, no pain. Took aleve with relief.

## 2024-02-18 NOTE — ED Notes (Signed)
 CMP and I-stat in progress

## 2024-02-18 NOTE — ED Provider Notes (Signed)
 [facility] [place] EMERGENCY DEPARTMENT AT [facility] Provider Note   CSN: [id number] Arrival date & time: 02/18/24  9070     Patient presents with: Leg Swelling (R)   Gregory Whitney is a 48 y.o. male.   HPI Patient presents for right leg pain and swelling.  Medical history includes HIV, HTN, alcohol use.  He has been adherent to his Biktarvy .  He will miss occasional doses.  This is typically in the setting of alcohol use.  Patient states that he typically drinks on weekends only.  He has not drank in the past 2 weeks.  For the past 10 days, he has had pain in his right lower extremity.  Pain is more prominent in the distal aspect.  He noticed swelling over the past week.  Swelling and pain have worsened, particularly over the past 24 hours.  He denies any systemic symptoms.  He was seen urgent care prior to arrival.  He was into the ED for further evaluation.      Prior to Admission medications   Medication Sig Start Date End Date Taking? Authorizing Provider  ADVIL PM 200-38 MG TABS Take 3 tablets by mouth at bedtime.   Yes [provider]  ALEVE 220 MG tablet Take 440 mg by mouth in the morning.   Yes [provider]  atenolol  (TENORMIN ) 50 MG tablet Take 1 tablet (50 mg total) by mouth daily. 08/29/23 08/23/24 Yes Vu, Constance DASEN, MD  bictegravir-emtricitabine -tenofovir  AF (BIKTARVY ) 50-200-25 MG TABS tablet TAKE 1 TABLET BY MOUTH DAILY. 03/04/23  Yes Melvenia Corean SAILOR, NP  cephALEXin  (KEFLEX ) 500 MG capsule Take 1 capsule (500 mg total) by mouth 4 (four) times daily for 5 days. 02/18/24 02/23/24 Yes Melvenia Motto, MD  rosuvastatin  (CRESTOR ) 10 MG tablet Take 1 tablet (10 mg total) by mouth daily. Patient not taking: Reported on 02/18/2024 10/01/23   Melvenia Corean SAILOR, NP    Allergies: Patient has no known allergies.    Review of Systems  Cardiovascular:  Positive for leg swelling.  Musculoskeletal:  Positive for myalgias.  Skin:  Positive for color change.  All other  systems reviewed and are negative.   Updated Vital Signs BP (!) 169/114 (BP Location: Right Arm)   Pulse (!) 57   Temp 98.4 F (36.9 C) (Oral)   Resp 20   Ht 6' 2 (1.88 m)   Wt 98.4 kg   SpO2 100%   BMI 27.86 kg/m   Physical Exam Vitals and nursing note reviewed.  Constitutional:      General: He is not in acute distress.    Appearance: Normal appearance. He is well-developed. He is not ill-appearing, toxic-appearing or diaphoretic.  HENT:     Head: Normocephalic and atraumatic.     Right Ear: External ear normal.     Left Ear: External ear normal.     Nose: Nose normal.     Mouth/Throat:     Mouth: Mucous membranes are moist.  Eyes:     Extraocular Movements: Extraocular movements intact.     Conjunctiva/sclera: Conjunctivae normal.  Cardiovascular:     Rate and Rhythm: Normal rate and regular rhythm.  Pulmonary:     Effort: Pulmonary effort is normal. No respiratory distress.  Abdominal:     General: There is no distension.     Palpations: Abdomen is soft.  Musculoskeletal:        General: Swelling and tenderness present.     Cervical back: Normal range of motion and neck  supple.  Skin:    General: Skin is warm and dry.     Findings: Erythema present.  Neurological:     General: No focal deficit present.     Mental Status: He is alert and oriented to person, place, and time.  Psychiatric:        Mood and Affect: Mood normal.        Behavior: Behavior normal.     (all labs ordered are listed, but only abnormal results are displayed) Labs Reviewed  CBC WITH DIFFERENTIAL/PLATELET - Abnormal; Notable for the following components:      Result Value   RBC 4.00 (*)    Hemoglobin 11.4 (*)    HCT 36.7 (*)    Platelets 420 (*)    All other components within normal limits  C-REACTIVE PROTEIN - Abnormal; Notable for the following components:   CRP 7.4 (*)    All other components within normal limits  PROTIME-INR  SEDIMENTATION RATE  CK  COMPREHENSIVE  METABOLIC PANEL WITH GFR  MAGNESIUM   I-STAT CG4 LACTIC ACID, ED  I-STAT CG4 LACTIC ACID, ED    EKG: None  Radiology: VAS US  LOWER EXTREMITY VENOUS (DVT) (ONLY [facility]) Result Date: 02/18/2024  Lower Venous DVT Study Patient Name:  Gregory Whitney  Date of Exam:   02/18/2024 Medical Rec #: [id number]    Accession #:    [id number] Date of Birth: 27-Sep-1975    Patient Gender: M Patient Age:   76 years Exam Location:  [facility] Procedure:      VAS US  LOWER EXTREMITY VENOUS (DVT) Referring Phys: BERNARDINO Melesio Madara --------------------------------------------------------------------------------  Indications: Swelling, and Edema.  Comparison Study: No prior Performing Technologist: Elmarie Lindau, RVT  Examination Guidelines: A complete evaluation includes B-mode imaging, spectral Doppler, color Doppler, and power Doppler as needed of all accessible portions of each vessel. Bilateral testing is considered an integral part of a complete examination. Limited examinations for reoccurring indications may be performed as noted. The reflux portion of the exam is performed with the patient in reverse Trendelenburg.  +---------+---------------+---------+-----------+----------+--------------+ RIGHT    CompressibilityPhasicitySpontaneityPropertiesThrombus Aging +---------+---------------+---------+-----------+----------+--------------+ CFV      Full           Yes      Yes                                 +---------+---------------+---------+-----------+----------+--------------+ SFJ      Full                                                        +---------+---------------+---------+-----------+----------+--------------+ FV Prox  Full                                                        +---------+---------------+---------+-----------+----------+--------------+ FV Mid   Full                                                         +---------+---------------+---------+-----------+----------+--------------+  FV DistalFull                                                        +---------+---------------+---------+-----------+----------+--------------+ PFV      Full                                                        +---------+---------------+---------+-----------+----------+--------------+ POP      Full           Yes      Yes                                 +---------+---------------+---------+-----------+----------+--------------+ PTV      Full                                                        +---------+---------------+---------+-----------+----------+--------------+ PERO     Full                                                        +---------+---------------+---------+-----------+----------+--------------+ There is a heterogeneous, vascularized mass in the groin measuring 3.10 x 1.27 cm. There is a cluster of varicose veins at the medial proximal calf area that are partially compresssible.  +----+---------------+---------+-----------+----------+--------------+ LEFTCompressibilityPhasicitySpontaneityPropertiesThrombus Aging +----+---------------+---------+-----------+----------+--------------+ CFV Full           Yes      Yes                                 +----+---------------+---------+-----------+----------+--------------+ There is a heterogeneous, vascularized mass in the groin measuring 2.19 x 1.05 cm.   Summary: RIGHT: - There is no evidence of deep vein thrombosis in the lower extremity.  - No cystic structure found in the popliteal fossa. - There is a heterogeneous, vascularized mass in the groin measuring 3.10 x 1.27 cm. There is a cluster of varicose veins at the medial proximal calf area that have chronic appearing, partially compressible thrombus.  LEFT: - No evidence of common femoral vein obstruction.  - There is a heterogeneous, vascularized mass in the groin measuring  2.19 x 1.05 cm.  *See table(s) above for measurements and observations.    Preliminary      Procedures   Medications Ordered in the ED  cephALEXin  (KEFLEX ) capsule 500 mg (has no administration in time range)  oxyCODONE -acetaminophen  (PERCOCET/ROXICET) 5-325 MG per tablet 1 tablet (1 tablet Oral Given 02/18/24 1010)                                    Medical Decision Making Amount and/or Complexity of Data Reviewed Labs: ordered. Radiology: ordered.  Risk Prescription drug management.  This patient presents to the ED for concern of right leg swelling and pain, this involves an extensive number of treatment options, and is a complaint that carries with it a high risk of complications and morbidity.  The differential diagnosis includes DVT, cellulitis, venous insufficiency   Co morbidities / Chronic conditions that complicate the patient evaluation  HIV, HTN, alcohol use   Additional history obtained:  Additional history obtained from EMR External records from outside source obtained and reviewed including N/A   Lab Tests:  I Ordered, and personally interpreted labs.  The pertinent results include: Hemoglobin is decreased from lab work from 5 months ago.  No leukocytosis is present. Remaining lab work pending at time of signout.   Imaging Studies ordered:  I ordered imaging studies including RLE DVT study, CT of pelvis I independently visualized and interpreted imaging which showed no acute DVT is identified.  There is a heterogeneous groin mass identified.  CT scan pending at time of signout. I agree with the radiologist interpretation   Cardiac Monitoring: / EKG:  The patient was maintained on a cardiac monitor.  I personally viewed and interpreted the cardiac monitored which showed an underlying rhythm of: Sinus rhythm   Problem List / ED Course / Critical interventions / Medication management  Patient presents for worsening pain and swelling to right lower  extremity.  On arrival in the ED, vital signs notable for hypertension.  Patient is overall well-appearing on exam.  He does have significant swelling in his right lower extremity that extends all the way to upper thigh.  Tenderness warmth, and erythema are present in distal lower extremity, below the knee.  Pulses are intact.  DVT study was ordered.  Pain medication was ordered.  DVT study was negative.  Given warmth, erythema, and tenderness in distal lower extremity, patient to be treated empirically for cellulitis.  DVT study did identify a groin mass which may have a compressive effects on vasculature. Patient states that he has noticed a swelling in his right groin area but that it has been present for years.  It is difficult to appreciate on exam.  This area is not painful to him.  CT scan of pelvis was ordered to further evaluate.  Initial lab work notable for no leukocytosis and decreased hemoglobin from baseline.  Prior lab work for comparison was 5 months ago.  Patient denies any recent blood loss.  CT imaging pending at time of signout.  Care of patient signed out oncoming ED provider. I ordered medication including Percocet for analgesia, Keflex  for empiric treatment of cellulitis Reevaluation of the patient after these medicines showed that the patient improved I have reviewed the patients home medicines and have made adjustments as needed  Social Determinants of Health:  Followed by infectious disease     Final diagnoses:  Right leg swelling    ED Discharge Orders          Ordered    cephALEXin  (KEFLEX ) 500 MG capsule  4 times daily        02/18/24 1511               Melvenia Motto, MD 02/18/24 [phone]

## 2024-02-18 NOTE — ED Notes (Signed)
 Patient is being discharged from the Urgent Care and sent to the Emergency Department via POV. Per Asberry Senters, PA, patient is in need of higher level of care due to leg swelling, possible DVT. Patient is aware and verbalizes understanding of plan of care.  Vitals:   02/18/24 0838  BP: (!) 158/99  Pulse: 60  Resp: 18  Temp: 98.2 F (36.8 C)  SpO2: 99%

## 2024-02-18 NOTE — ED Triage Notes (Signed)
 Patient to ED by POV with c/o right leg swelling x1 week. Reports HX of HTN. Takes aleve OTC with no relief. Has feeling in feet with some numbness states feet swollen also. Denies cough.

## 2024-02-18 NOTE — Progress Notes (Signed)
 RLE venous exam is completed. Hason Ofarrell, RVT

## 2024-02-18 NOTE — ED Provider Notes (Signed)
 Received patient in turnover from Dr. Melvenia.  Please see their note for further details of Hx, PE.  Briefly patient is a 48 y.o. male with a Leg Swelling (R) .  Patient with swelling for about a week.  Negative for DVT but ? Old thrombus in groin  Plan for CT imaging.  CT imaging with concern for possible deep space infection.  Radiology read with possible Fournier's gangrene.  Patient reassessed and appears well.  He does have significant edema from the right buttock into the groin all the way down the leg.  I will start him on broad-spectrum antibiotics.  Will discuss with general surgery.  No leukocytosis, sed rate and CRP are significantly elevated.  Patient is evaluated by Dr. Cameron.  Recommends medical admission.  The patients results and plan were reviewed and discussed.   Any x-rays performed were independently reviewed by myself.   Differential diagnosis were considered with the presenting HPI.  Medications  vancomycin  (VANCOREADY) IVPB 2000 mg/400 mL (2,000 mg Intravenous New Bag/Given 02/18/24 1917)  atenolol  (TENORMIN ) tablet 50 mg (has no administration in time range)  bictegravir-emtricitabine -tenofovir  AF (BIKTARVY ) 50-200-25 MG per tablet 1 tablet (has no administration in time range)  enoxaparin  (LOVENOX ) injection 40 mg (has no administration in time range)  senna-docusate (Senokot-S) tablet 1 tablet (has no administration in time range)  ondansetron  (ZOFRAN ) tablet 4 mg (has no administration in time range)    Or  ondansetron  (ZOFRAN ) injection 4 mg (has no administration in time range)  acetaminophen  (TYLENOL ) tablet 1,000 mg (has no administration in time range)  melatonin tablet 3 mg (has no administration in time range)  oxyCODONE -acetaminophen  (PERCOCET/ROXICET) 5-325 MG per tablet 1 tablet (1 tablet Oral Given 02/18/24 1010)  cephALEXin  (KEFLEX ) capsule 500 mg (500 mg Oral Given 02/18/24 1719)  iohexol  (OMNIPAQUE ) 300 MG/ML solution 100 mL (100 mLs Intravenous  Contrast Given 02/18/24 1739)  piperacillin -tazobactam (ZOSYN ) IVPB 3.375 g (0 g Intravenous Stopped 02/18/24 1914)  clindamycin  (CLEOCIN ) IVPB 900 mg (0 mg Intravenous Stopped 02/18/24 1948)  hydrOXYzine  (ATARAX ) tablet 25 mg (25 mg Oral Given 02/18/24 2047)    Vitals:   02/18/24 1058 02/18/24 1321 02/18/24 1431 02/18/24 1734  BP: (!) 169/124  (!) 169/114 (!) 165/108  Pulse: 62  (!) 57 70  Resp: 18  20 20   Temp:  98.1 F (36.7 C) 98.4 F (36.9 C) 98.2 F (36.8 C)  TempSrc:  Oral Oral Oral  SpO2: 100%  100% 90%  Weight:      Height:        Final diagnoses:  Right leg swelling    Admission/ observation were discussed with the admitting physician, patient and/or family and they are comfortable with the plan.        Emil Share, DO 02/18/24 2113

## 2024-02-18 NOTE — ED Provider Notes (Signed)
 [facility]    CSN: [id number] Arrival date & time: 02/18/24  0805      History   Chief Complaint Chief Complaint  Patient presents with   APPT-leg swelling    HPI Gregory Whitney is a 48 y.o. male.   Patient reports today for evaluation of right leg swelling that has been ongoing for the last 24 hours.  He reports that he slept on the floor and is not sure if this exacerbated symptoms.  He has swelling from the top of his leg down.  He does report significant pain, including if calf is palpated.  He also notes pressure with same.  He did take Aleve with mild relief.  He denies any fever.  The history is provided by the patient.    Past Medical History:  Diagnosis Date   Arthritis    Blood dyscrasia    hiv   Closed fracture of lateral portion of left tibial plateau with routine healing 06/10/2014   HIV (human immunodeficiency virus infection) (HCC)    Hypertension    no med at present-stopped(atenolol ) by self 8 months ago   Leg fracture    left    Patient Active Problem List   Diagnosis Date Noted   Hidradenitis suppurativa of multiple sites 08/15/2022   Healthcare maintenance 02/14/2022   Stiffness of hand joint 01/30/2021   Alcohol use 01/30/2021   TOBACCO ABUSE 02/22/2010   Essential hypertension, benign 11/16/2009   HIV disease  09/02/2009    Past Surgical History:  Procedure Laterality Date   LEG SURGERY Right 2011   ?boil upper thigh   ORIF TIBIA PLATEAU Left 06/10/2014   Procedure: OPEN REDUCTION INTERNAL FIXATION (ORIF) TIBIAL PLATEAU;  Surgeon: Fonda SHAUNNA Olmsted, MD;  Location: [facility] OR;  Service: Orthopedics;  Laterality: Left;       Home Medications    Prior to Admission medications   Medication Sig Start Date End Date Taking? Authorizing Provider  atenolol  (TENORMIN ) 50 MG tablet Take 1 tablet (50 mg total) by mouth daily. 08/29/23 08/23/24  Vu, Constance T, MD  bictegravir-emtricitabine -tenofovir  AF (BIKTARVY ) 50-200-25 MG TABS tablet TAKE 1  TABLET BY MOUTH DAILY. 03/04/23   Melvenia Corean SAILOR, NP  rosuvastatin  (CRESTOR ) 10 MG tablet Take 1 tablet (10 mg total) by mouth daily. Patient not taking: Reported on 02/18/2024 10/01/23   Melvenia Corean SAILOR, NP    Family History Family History  Problem Relation Age of Onset   Hypertension Father     Social History Social History   Tobacco Use   Smoking status: Every Day    Current packs/day: 1.00    Average packs/day: 1 pack/day for 15.0 years (15.0 ttl pk-yrs)    Types: Cigarettes   Smokeless tobacco: Never  Vaping Use   Vaping status: Never Used  Substance Use Topics   Alcohol use: Yes    Alcohol/week: 14.0 standard drinks of alcohol    Types: 14 Cans of beer per week    Comment: 2 40's a day   Drug use: Yes    Types: Marijuana    Comment: marijuana weekly     Allergies   Patient has no known allergies.   Review of Systems Review of Systems  Constitutional:  Negative for chills and fever.  Eyes:  Negative for discharge and redness.  Respiratory:  Negative for shortness of breath.   Cardiovascular:  Positive for leg swelling.  Skin:  Positive for color change. Negative for wound.  Neurological:  Negative for numbness.  Physical Exam Triage Vital Signs ED Triage Vitals  Encounter Vitals Group     BP 02/18/24 0838 (!) 158/99     Girls Systolic BP Percentile --      Girls Diastolic BP Percentile --      Boys Systolic BP Percentile --      Boys Diastolic BP Percentile --      Pulse Rate 02/18/24 0838 60     Resp 02/18/24 0838 18     Temp 02/18/24 0838 98.2 F (36.8 C)     Temp Source 02/18/24 0838 Oral     SpO2 02/18/24 0838 99 %     Weight --      Height --      Head Circumference --      Peak Flow --      Pain Score 02/18/24 0839 0     Pain Loc --      Pain Education --      Exclude from Growth Chart --    No data found.  Updated Vital Signs BP (!) 158/99 (BP Location: Right Arm)   Pulse 60   Temp 98.2 F (36.8 C) (Oral)   Resp 18    SpO2 99%   Visual Acuity Right Eye Distance:   Left Eye Distance:   Bilateral Distance:    Right Eye Near:   Left Eye Near:    Bilateral Near:     Physical Exam Vitals and nursing note reviewed.  Constitutional:      General: He is not in acute distress.    Appearance: Normal appearance. He is not ill-appearing.  HENT:     Head: Normocephalic and atraumatic.  Eyes:     Conjunctiva/sclera: Conjunctivae normal.  Cardiovascular:     Rate and Rhythm: Normal rate.  Pulmonary:     Effort: Pulmonary effort is normal. No respiratory distress.  Musculoskeletal:     Comments: Diffuse swelling to right upper and lower leg with darkening of lower leg  Neurological:     Mental Status: He is alert.  Psychiatric:        Mood and Affect: Mood normal.        Behavior: Behavior normal.        Thought Content: Thought content normal.      UC Treatments / Results  Labs (all labs ordered are listed, but only abnormal results are displayed) Labs Reviewed - No data to display  EKG   Radiology No results found.  Procedures Procedures (including critical care time)  Medications Ordered in UC Medications - No data to display  Initial Impression / Assessment and Plan / UC Course  I have reviewed the triage vital signs and the nursing notes.  Pertinent labs & imaging results that were available during my care of the patient were reviewed by me and considered in my medical decision making (see chart for details).    Given diffuse swelling and color changes with reported tingling to right foot advised further evaluation in the emergency room to rule out DVT versus diffuse cellulitis.  Patient is agreeable with plan.  He will transport via POV and feel safe to do so.  Final Clinical Impressions(s) / UC Diagnoses   Final diagnoses:  Right leg swelling   Discharge Instructions   None    ED Prescriptions   None    PDMP not reviewed this encounter.   Billy Asberry FALCON,  PA-C 02/18/24 [phone]

## 2024-02-19 ENCOUNTER — Other Ambulatory Visit (HOSPITAL_COMMUNITY): Payer: Self-pay

## 2024-02-19 ENCOUNTER — Encounter (HOSPITAL_COMMUNITY): Payer: Self-pay | Admitting: Student

## 2024-02-19 DIAGNOSIS — L732 Hidradenitis suppurativa: Secondary | ICD-10-CM | POA: Diagnosis not present

## 2024-02-19 DIAGNOSIS — L03115 Cellulitis of right lower limb: Secondary | ICD-10-CM | POA: Diagnosis not present

## 2024-02-19 LAB — CBC
HCT: 31.6 % — ABNORMAL LOW (ref 39.0–52.0)
Hemoglobin: 10 g/dL — ABNORMAL LOW (ref 13.0–17.0)
MCH: 28.6 pg (ref 26.0–34.0)
MCHC: 31.6 g/dL (ref 30.0–36.0)
MCV: 90.3 fL (ref 80.0–100.0)
Platelets: 374 K/uL (ref 150–400)
RBC: 3.5 MIL/uL — ABNORMAL LOW (ref 4.22–5.81)
RDW: 14.8 % (ref 11.5–15.5)
WBC: 5.6 K/uL (ref 4.0–10.5)
nRBC: 0 % (ref 0.0–0.2)

## 2024-02-19 LAB — BASIC METABOLIC PANEL WITH GFR
Anion gap: 11 (ref 5–15)
BUN: 11 mg/dL (ref 6–20)
CO2: 21 mmol/L — ABNORMAL LOW (ref 22–32)
Calcium: 7.9 mg/dL — ABNORMAL LOW (ref 8.9–10.3)
Chloride: 102 mmol/L (ref 98–111)
Creatinine, Ser: 0.99 mg/dL (ref 0.61–1.24)
GFR, Estimated: >60 mL/min (ref >60)
Glucose, Bld: 95 mg/dL (ref 70–99)
Potassium: 3.3 mmol/L — ABNORMAL LOW (ref 3.5–5.1)
Sodium: 134 mmol/L — ABNORMAL LOW (ref 135–145)

## 2024-02-19 MED ORDER — POTASSIUM CHLORIDE CRYS ER 20 MEQ PO TBCR
40.0000 meq | EXTENDED_RELEASE_TABLET | ORAL | Status: AC
  Administered 2024-02-19 (×2): 40 meq via ORAL
  Filled 2024-02-19 (×2): qty 2

## 2024-02-19 MED ORDER — LOSARTAN POTASSIUM 25 MG PO TABS
25.0000 mg | ORAL_TABLET | Freq: Every day | ORAL | 2 refills | Status: DC
  Filled 2024-02-19: qty 30, 30d supply, fill #0

## 2024-02-19 MED ORDER — CEFADROXIL 500 MG PO CAPS
1000.0000 mg | ORAL_CAPSULE | Freq: Two times a day (BID) | ORAL | 0 refills | Status: DC
  Filled 2024-02-19: qty 36, 9d supply, fill #0

## 2024-02-19 MED ORDER — CALCIUM CARBONATE ANTACID 500 MG PO CHEW
1.0000 | CHEWABLE_TABLET | Freq: Three times a day (TID) | ORAL | Status: DC
  Administered 2024-02-19: 200 mg via ORAL
  Filled 2024-02-19: qty 1

## 2024-02-19 NOTE — Progress Notes (Signed)
 Subjective: CC: Patient laying comfortably in bed. No complaints of pain in LE. Denies subjective fever. He checked into the ED because of concern for scaling accompanied by swelling of the RLE that started last Monday. Stated that he had a similar encounter of leg swelling in 2016 but it spontaneously resolved. His desire is to be discharged today.   Objective: Vital signs in last 24 hours: Temp:  [97.9 F (36.6 C)-98.4 F (36.9 C)] 97.9 F (36.6 C) (07/16 9386) Pulse Rate:  [55-70] 63 (07/16 0613) Resp:  [16-20] 16 (07/16 0613) BP: (138-172)/(94-124) 140/102 (07/16 0613) SpO2:  [90 %-100 %] 100 % (07/16 9386) Weight:  [98.4 kg] 98.4 kg (07/15 0938) Last BM Date : 02/18/24  Intake/Output from previous day: 07/15 0701 - 07/16 0700 In: 100 [IV Piggyback:100] Out: 200 [Urine:200] Intake/Output this shift: No intake/output data recorded.  PE: Physical Exam Constitutional:      General: He is not in acute distress.    Appearance: He is not ill-appearing or toxic-appearing.  Cardiovascular:     Rate and Rhythm: Normal rate.     Pulses: Normal pulses.  Pulmonary:     Effort: Pulmonary effort is normal.  Genitourinary:    Comments: In the right perineal region are several areas with chronic thin, purulent appearing drainage and sinus tracts that is consistent with known history of hidradenitis.  No significant tenderness, no crepitus, no erythema or warmth noted. Musculoskeletal:        General: Swelling present. No tenderness or signs of injury. Normal range of motion.     Right lower leg: Swelling present. 2+ Pitting Edema present.     Left lower leg: Normal. No swelling. No edema.  Skin:    General: Skin is warm and dry.     Capillary Refill: Capillary refill takes less than 2 seconds.     Findings: No erythema.  Neurological:     General: No focal deficit present.     Mental Status: He is alert and oriented to person, place, and time.    Lab Results:  Recent  Labs    02/18/24 1012 02/18/24 1721 02/19/24 0536  WBC 6.7  --  5.6  HGB 11.4* 12.2* 10.0*  HCT 36.7* 36.0* 31.6*  PLT 420*  --  374   BMET Recent Labs    02/18/24 1715 02/18/24 1721 02/19/24 0536  NA 139 140 134*  K 3.5 3.4* 3.3*  CL 104 105 102  CO2 25  --  21*  GLUCOSE 90 85 95  BUN 13 12 11   CREATININE 0.99 1.10 0.99  CALCIUM  8.3*  --  7.9*   PT/INR Recent Labs    02/18/24 1012  LABPROT 13.6  INR 1.0   CMP     Component Value Date/Time   NA 134 (L) 02/19/2024 0536   K 3.3 (L) 02/19/2024 0536   CL 102 02/19/2024 0536   CO2 21 (L) 02/19/2024 0536   GLUCOSE 95 02/19/2024 0536   BUN 11 02/19/2024 0536   CREATININE 0.99 02/19/2024 0536   CREATININE 1.14 08/29/2023 1053   CALCIUM  7.9 (L) 02/19/2024 0536   PROT 7.2 02/18/2024 1715   ALBUMIN 3.0 (L) 02/18/2024 1715   AST 13 (L) 02/18/2024 1715   ALT 13 02/18/2024 1715   ALKPHOS 48 02/18/2024 1715   BILITOT 0.5 02/18/2024 1715   GFRNONAA >60 02/19/2024 0536   GFRNONAA 107 01/10/2021 1042   GFRAA 124 01/10/2021 1042   Lipase  No results  found for: LIPASE  Studies/Results: CT PELVIS W CONTRAST Result Date: 02/18/2024 CLINICAL DATA:  Right lower extremity swelling for 1 week. EXAM: CT PELVIS WITH CONTRAST TECHNIQUE: Multidetector CT imaging of the pelvis was performed using the standard protocol following the bolus administration of intravenous contrast. RADIATION DOSE REDUCTION: This exam was performed according to the departmental dose-optimization program which includes automated exposure control, adjustment of the mA and/or kV according to patient size and/or use of iterative reconstruction technique. CONTRAST:  OMNIPAQUE  IOHEXOL  300 MG/ML  SOLN COMPARISON:  None Available. FINDINGS: Urinary Tract:  Urinary bladder is unremarkable. Bowel: Moderate wall thickening of distal sigmoid colon and rectum is noted suggesting proctocolitis. Vascular/Lymphatic: No significant vascular abnormality is noted. Right  inguinal adenopathy is noted with the largest lymph node measuring 2.9 x 2.1 cm. Left inguinal adenopathy is also noted with largest lymph node measuring 3.5 x 1.8 cm. Right external and internal iliac adenopathy is also noted with the largest measuring 29 x 13 mm. Reproductive:  Prostate is unremarkable. Other: Small fat containing periumbilical hernia. No ascites. However, there is extensive thickening of the subcutaneous tissues of the right perineum and buttocks with gas suggesting necrotizing cellulitis. Musculoskeletal: No fracture or other significant osseous abnormality is noted. IMPRESSION: Extensive thickening of subcutaneous tissues of the right perineum and right buttocks posteriorly is noted which contains gas suggesting necrotizing cellulitis or possibly Fournier's gangrene. Enlarged bilateral inguinal adenopathy is noted as well as right external and internal iliac adenopathy is noted. While this may be inflammatory or infectious in etiology, malignancy cannot be excluded and tissue sampling is recommended. Moderate wall thickening of distal sigmoid colon and rectum is noted suggesting proctocolitis. Electronically Signed   By: Lynwood Landy Raddle M.D.   On: 02/18/2024 17:59   VAS US  LOWER EXTREMITY VENOUS (DVT) (ONLY [facility] & [facility]) Result Date: 02/18/2024  Lower Venous DVT Study Patient Name:  Gregory Whitney  Date of Exam:   02/18/2024 Medical Rec #: [id number]    Accession #:    [id number] Date of Birth: [birth year]    Patient Gender: M Patient Age:   48 years Exam Location:  [facility] Procedure:      VAS US  LOWER EXTREMITY VENOUS (DVT) Referring Phys: BERNARDINO FIREMAN --------------------------------------------------------------------------------  Indications: Swelling, and Edema.  Comparison Study: No prior Performing Technologist: Elmarie Lindau, RVT  Examination Guidelines: A complete evaluation includes B-mode imaging, spectral Doppler, color Doppler, and power Doppler as needed of all accessible  portions of each vessel. Bilateral testing is considered an integral part of a complete examination. Limited examinations for reoccurring indications may be performed as noted. The reflux portion of the exam is performed with the patient in reverse Trendelenburg.  +---------+---------------+---------+-----------+----------+--------------+ RIGHT    CompressibilityPhasicitySpontaneityPropertiesThrombus Aging +---------+---------------+---------+-----------+----------+--------------+ CFV      Full           Yes      Yes                                 +---------+---------------+---------+-----------+----------+--------------+ SFJ      Full                                                        +---------+---------------+---------+-----------+----------+--------------+ FV Prox  Full                                                        +---------+---------------+---------+-----------+----------+--------------+  FV Mid   Full                                                        +---------+---------------+---------+-----------+----------+--------------+ FV DistalFull                                                        +---------+---------------+---------+-----------+----------+--------------+ PFV      Full                                                        +---------+---------------+---------+-----------+----------+--------------+ POP      Full           Yes      Yes                                 +---------+---------------+---------+-----------+----------+--------------+ PTV      Full                                                        +---------+---------------+---------+-----------+----------+--------------+ PERO     Full                                                        +---------+---------------+---------+-----------+----------+--------------+ There is a heterogeneous, vascularized mass in the groin measuring 3.10 x 1.27 cm. There  is a cluster of varicose veins at the medial proximal calf area that are partially compresssible.  +----+---------------+---------+-----------+----------+--------------+ LEFTCompressibilityPhasicitySpontaneityPropertiesThrombus Aging +----+---------------+---------+-----------+----------+--------------+ CFV Full           Yes      Yes                                 +----+---------------+---------+-----------+----------+--------------+ There is a heterogeneous, vascularized mass in the groin measuring 2.19 x 1.05 cm.   Summary: RIGHT: - There is no evidence of deep vein thrombosis in the lower extremity.  - No cystic structure found in the popliteal fossa. - There is a heterogeneous, vascularized mass in the groin measuring 3.10 x 1.27 cm. There is a cluster of varicose veins at the medial proximal calf area that have chronic appearing, partially compressible thrombus.  LEFT: - No evidence of common femoral vein obstruction.  - There is a heterogeneous, vascularized mass in the groin measuring 2.19 x 1.05 cm.  *See table(s) above for measurements and observations. Electronically signed by Debby Robertson on 02/18/2024 at 5:17:52 PM.    Final     Anti-infectives: Anti-infectives (From admission, onward)    Start     Dose/Rate Route Frequency Ordered Stop  02/19/24 1000  bictegravir-emtricitabine -tenofovir  AF (BIKTARVY ) 50-200-25 MG per tablet 1 tablet        1 tablet Oral Daily 02/18/24 2021     02/19/24 0200  ceFAZolin  (ANCEF ) IVPB 2g/100 mL premix        2 g 200 mL/hr over 30 Minutes Intravenous Every 8 hours 02/18/24 2249     02/18/24 1815  vancomycin  (VANCOREADY) IVPB 2000 mg/400 mL        2,000 mg 200 mL/hr over 120 Minutes Intravenous  Once 02/18/24 1807 02/18/24 2120   02/18/24 1815  piperacillin -tazobactam (ZOSYN ) IVPB 3.375 g        3.375 g 100 mL/hr over 30 Minutes Intravenous  Once 02/18/24 1807 02/18/24 1914   02/18/24 1815  clindamycin  (CLEOCIN ) IVPB 900 mg        900  mg 100 mL/hr over 30 Minutes Intravenous  Once 02/18/24 1809 02/18/24 1948   02/18/24 1515  cephALEXin  (KEFLEX ) capsule 500 mg        500 mg Oral  Once 02/18/24 1509 02/18/24 1719   02/18/24 0000  cephALEXin  (KEFLEX ) 500 MG capsule        500 mg Oral 4 times daily 02/18/24 1511 02/23/24 2359        Assessment/Plan RLE Cellulitis, Hidradenitis  -Continue IV abx  -Pain management  -Monitor CBC -WBC normal  -Patient is HDS and in NAD and wishes to be discharged. -Based on patient presentation of RLE cellulitis as well as known hx of hidradenitis, I think the continuation of current conservative management with IV abx and pain control is reasonable. Surgical intervention is not needed at this time for HS. We will sign off but will be available should our services be needed for further consultation on this patient.   FEN - Regular  VTE - SCDs, Lovenox  ID - Ancef    HTN HIV- on Biktarvy     I reviewed nursing notes, ED provider notes, hospitalist notes, last 24 h vitals and pain scores, last 48 h intake and output, last 24 h labs and trends, and last 24 h imaging results.     LOS: 0 days    Eulah Hammonds, [facility] 02/19/2024, 7:41 AM Please see Amion for pager number during day hours 7:00am-4:30pm

## 2024-02-19 NOTE — Plan of Care (Signed)

## 2024-02-19 NOTE — Discharge Instructions (Signed)
 Gregory Whitney,  Going to hospital because of a right leg infection.  There is also concern for possible abscess, however this did not appear to be concerning from the surgeons point of view.  Your symptoms have significantly proved with antibiotics and he will be discharged home with continued antibiotic therapy.  Your blood pressure was elevated this admission, so I have started you on a blood pressure medication called losartan .  Please take this daily.  Please follow-up with your primary care physician for ongoing medical management.

## 2024-02-19 NOTE — Progress Notes (Signed)
   02/19/24 0849  TOC Brief Assessment  Insurance and Status Reviewed  Patient has primary care physician Yes  Home environment has been reviewed home w/ family  Prior level of function: independent  Prior/Current Home Services No current home services  Social Drivers of Health Review SDOH reviewed no interventions necessary  Readmission risk has been reviewed Yes  Transition of care needs no transition of care needs at this time

## 2024-02-19 NOTE — Discharge Summary (Signed)
 Physician Discharge Summary   Patient: Gregory Whitney MRN: [id number] DOB: October 21, 1975  Admit date:     02/18/2024  Discharge date: 02/19/24  Discharge Physician: Elgin Lam, MD   PCP: Pcp, No   Recommendations at discharge:  PCP visit for hospital follow-up  Discharge Diagnoses: Principal Problem:   Cellulitis of right lower extremity Active Problems:   Right leg swelling   Insomnia  Resolved Problems:   * No resolved hospital problems. *  Hospital Course: [name] is a 48 y.o. male with a history of HIV, hypertension, hyperlipidemia.  Patient presented secondary to right lower extremity swelling with evidence of cellulitis and initial concern for possible Fournier's gangrene.  General surgery was consulted and per their assessment, no evidence of Fournier's gangrene.  Patient was treat empirically with antibiotics with improvement in symptoms.  Patient discharged on cefadroxil  for discharge.  Assessment and Plan:  Cellulitis of right lower extremity CT imaging on admission was concerning for possible gas in the right perineum and right buttock concerning for necrotizing cellulitis and possible Fournier's gangrene.  General surgery was consulted and reviewed patient's CT scan and this agrees that there is likely abscess/Fournier's gangrene and findings are more so related to patient's history of hidradenitis suppurativa and sinus tracts.  Patient was treated empirically on vancomycin , Zosyn , clindamycin  with eventual transition to cefazolin .  Patient with improvement of swelling and symptoms of right leg pain and erythema.  Patient discharged to continue antibiotic treatment with cefadroxil  on discharge.  HIV Continue Biktarvy .  Follow-up with ID as previously scheduled.  Primary hypertension Continue atenolol .  Patient with uncontrolled blood pressure during admission; losartan  started.   Consultants: General Surgery Procedures performed: None Disposition: Home Diet  recommendation: Regular diet   DISCHARGE MEDICATION: Allergies as of 02/19/2024   No Known Allergies      Medication List     STOP taking these medications    Advil PM 200-38 MG Tabs Generic drug: Ibuprofen-diphenhydrAMINE  Cit   Aleve 220 MG tablet Generic drug: naproxen sodium       TAKE these medications    atenolol  50 MG tablet Commonly known as: TENORMIN  Take 1 tablet (50 mg total) by mouth daily.   Biktarvy  50-200-25 MG Tabs tablet Generic drug: bictegravir-emtricitabine -tenofovir  AF TAKE 1 TABLET BY MOUTH DAILY.   cefadroxil  500 MG capsule Commonly known as: DURICEF Take 2 capsules (1,000 mg total) by mouth 2 (two) times daily for 9 days.   losartan  25 MG tablet Commonly known as: COZAAR  Take 1 tablet (25 mg total) by mouth daily.   rosuvastatin  10 MG tablet Commonly known as: Crestor  Take 1 tablet (10 mg total) by mouth daily.        Follow-up Information     [facility]             . Schedule an appointment as soon as possible for a visit .   Why: For follow-up. Contact information: [address]  [place]  [address]               Discharge Exam: BP (!) 167/110 (BP Location: Right Arm)   Pulse (!) 53   Temp 98.3 F (36.8 C) (Oral)   Resp 16   Ht 6' 2 (1.88 m)   Wt 98.4 kg   SpO2 100%   BMI 27.86 kg/m   General exam: Appears calm and comfortable Respiratory system: Clear to auscultation. Respiratory effort normal. Cardiovascular system: S1 & S2 heard, RRR. No murmurs,  rubs, gallops or clicks. Gastrointestinal system: Abdomen is nondistended, soft and nontender. No organomegaly or masses felt. Normal bowel sounds heard. Central nervous system: Alert and oriented. No focal neurological deficits. Musculoskeletal: Mild RLE edema. No calf tenderness Skin: No cyanosis. No rashes Psychiatry: Judgement and insight appear normal. Mood & affect appropriate.   Condition at  discharge: stable  The results of significant diagnostics from this hospitalization (including imaging, microbiology, ancillary and laboratory) are listed below for reference.   Imaging Studies: CT PELVIS W CONTRAST Result Date: 02/18/2024 CLINICAL DATA:  Right lower extremity swelling for 1 week. EXAM: CT PELVIS WITH CONTRAST TECHNIQUE: Multidetector CT imaging of the pelvis was performed using the standard protocol following the bolus administration of intravenous contrast. RADIATION DOSE REDUCTION: This exam was performed according to the departmental dose-optimization program which includes automated exposure control, adjustment of the mA and/or kV according to patient size and/or use of iterative reconstruction technique. CONTRAST:  OMNIPAQUE  IOHEXOL  300 MG/ML  SOLN COMPARISON:  None Available. FINDINGS: Urinary Tract:  Urinary bladder is unremarkable. Bowel: Moderate wall thickening of distal sigmoid colon and rectum is noted suggesting proctocolitis. Vascular/Lymphatic: No significant vascular abnormality is noted. Right inguinal adenopathy is noted with the largest lymph node measuring 2.9 x 2.1 cm. Left inguinal adenopathy is also noted with largest lymph node measuring 3.5 x 1.8 cm. Right external and internal iliac adenopathy is also noted with the largest measuring 29 x 13 mm. Reproductive:  Prostate is unremarkable. Other: Small fat containing periumbilical hernia. No ascites. However, there is extensive thickening of the subcutaneous tissues of the right perineum and buttocks with gas suggesting necrotizing cellulitis. Musculoskeletal: No fracture or other significant osseous abnormality is noted. IMPRESSION: Extensive thickening of subcutaneous tissues of the right perineum and right buttocks posteriorly is noted which contains gas suggesting necrotizing cellulitis or possibly Fournier's gangrene. Enlarged bilateral inguinal adenopathy is noted as well as right external and internal iliac  adenopathy is noted. While this may be inflammatory or infectious in etiology, malignancy cannot be excluded and tissue sampling is recommended. Moderate wall thickening of distal sigmoid colon and rectum is noted suggesting proctocolitis. Electronically Signed   By: Lynwood Landy Raddle M.D.   On: 02/18/2024 17:59   VAS US  LOWER EXTREMITY VENOUS (DVT) (ONLY [facility]) Result Date: 02/18/2024  Lower Venous DVT Study Patient Name:  OCEAN SCHILDT  Date of Exam:   02/18/2024 Medical Rec #: [id number]    Accession #:    [id number] Date of Birth: 01-26-1976    Patient Gender: M Patient Age:   89 years Exam Location:  [facility] Procedure:      VAS US  LOWER EXTREMITY VENOUS (DVT) Referring Phys: BERNARDINO FIREMAN --------------------------------------------------------------------------------  Indications: Swelling, and Edema.  Comparison Study: No prior Performing Technologist: Elmarie Lindau, RVT  Examination Guidelines: A complete evaluation includes B-mode imaging, spectral Doppler, color Doppler, and power Doppler as needed of all accessible portions of each vessel. Bilateral testing is considered an integral part of a complete examination. Limited examinations for reoccurring indications may be performed as noted. The reflux portion of the exam is performed with the patient in reverse Trendelenburg.  +---------+---------------+---------+-----------+----------+--------------+ RIGHT    CompressibilityPhasicitySpontaneityPropertiesThrombus Aging +---------+---------------+---------+-----------+----------+--------------+ CFV      Full           Yes      Yes                                 +---------+---------------+---------+-----------+----------+--------------+  SFJ      Full                                                        +---------+---------------+---------+-----------+----------+--------------+ FV Prox  Full                                                         +---------+---------------+---------+-----------+----------+--------------+ FV Mid   Full                                                        +---------+---------------+---------+-----------+----------+--------------+ FV DistalFull                                                        +---------+---------------+---------+-----------+----------+--------------+ PFV      Full                                                        +---------+---------------+---------+-----------+----------+--------------+ POP      Full           Yes      Yes                                 +---------+---------------+---------+-----------+----------+--------------+ PTV      Full                                                        +---------+---------------+---------+-----------+----------+--------------+ PERO     Full                                                        +---------+---------------+---------+-----------+----------+--------------+ There is a heterogeneous, vascularized mass in the groin measuring 3.10 x 1.27 cm. There is a cluster of varicose veins at the medial proximal calf area that are partially compresssible.  +----+---------------+---------+-----------+----------+--------------+ LEFTCompressibilityPhasicitySpontaneityPropertiesThrombus Aging +----+---------------+---------+-----------+----------+--------------+ CFV Full           Yes      Yes                                 +----+---------------+---------+-----------+----------+--------------+ There is a heterogeneous, vascularized mass in the groin measuring 2.19 x 1.05 cm.   Summary: RIGHT: - There is no evidence of  deep vein thrombosis in the lower extremity.  - No cystic structure found in the popliteal fossa. - There is a heterogeneous, vascularized mass in the groin measuring 3.10 x 1.27 cm. There is a cluster of varicose veins at the medial proximal calf area that have chronic appearing, partially  compressible thrombus.  LEFT: - No evidence of common femoral vein obstruction.  - There is a heterogeneous, vascularized mass in the groin measuring 2.19 x 1.05 cm.  *See table(s) above for measurements and observations. Electronically signed by Debby Robertson on 02/18/2024 at 5:17:52 PM.    Final     Microbiology: Results for orders placed or performed during the hospital encounter of 04/24/10  AFB culture     Status: None   Collection Time: 04/24/10 10:00 AM   Specimen: Wound  Result Value Ref Range Status   Specimen Description WOUND  Final   Special Requests RIGHT PERINEUM HYDRADINITIS  Final   Acid Fast Smear NO ACID FAST BACILLI SEEN  Final   Culture NO ACID FAST BACILLI ISOLATED IN 6 WEEKS  Final   Report Status 06/08/2010 FINAL  Final  Anaerobic culture     Status: None   Collection Time: 04/24/10 10:00 AM   Specimen: Wound  Result Value Ref Range Status   Specimen Description WOUND  Final   Special Requests RIGHT PERINEUM HYDRADINITIS  Final   Gram Stain   Final    RARE WBC PRESENT, PREDOMINANTLY PMN RARE SQUAMOUS EPITHELIAL CELLS PRESENT NO ORGANISMS SEEN   Culture NO ANAEROBES ISOLATED  Final   Report Status 04/28/2010 FINAL  Final  Wound culture     Status: None   Collection Time: 04/24/10 10:00 AM   Specimen: Wound  Result Value Ref Range Status   Specimen Description WOUND  Final   Special Requests RIGHT PERINEUM HYDRADINITIS  Final   Gram Stain   Final    RARE WBC PRESENT, PREDOMINANTLY PMN RARE SQUAMOUS EPITHELIAL CELLS PRESENT NO ORGANISMS SEEN   Culture   Final    MULTIPLE ORGANISMS PRESENT, NONE PREDOMINANT Note: NO STAPHYLOCOCCUS AUREUS ISOLATED NO GROUP A STREP (S.PYOGENES) ISOLATED   Report Status 04/26/2010 FINAL  Final    Labs: CBC: Recent Labs  Lab 02/18/24 1012 02/18/24 1721 02/19/24 0536  WBC 6.7  --  5.6  NEUTROABS 4.0  --   --   HGB 11.4* 12.2* 10.0*  HCT 36.7* 36.0* 31.6*  MCV 91.8  --  90.3  PLT 420*  --  374   Basic Metabolic  Panel: Recent Labs  Lab 02/18/24 1715 02/18/24 1721 02/19/24 0536  NA 139 140 134*  K 3.5 3.4* 3.3*  CL 104 105 102  CO2 25  --  21*  GLUCOSE 90 85 95  BUN 13 12 11   CREATININE 0.99 1.10 0.99  CALCIUM  8.3*  --  7.9*  MG 2.1  --   --    Liver Function Tests: Recent Labs  Lab 02/18/24 1715  AST 13*  ALT 13  ALKPHOS 48  BILITOT 0.5  PROT 7.2  ALBUMIN 3.0*    Discharge time spent: 35 minutes.  Signed: Elgin Lam, MD [facility] 02/19/2024

## 2024-02-20 NOTE — Hospital Course (Signed)
 Gregory Whitney is a 48 y.o. male with a history of HIV, hypertension, hyperlipidemia.  Patient presented secondary to right lower extremity swelling with evidence of cellulitis and initial concern for possible Fournier's gangrene.  General surgery was consulted and per their assessment, no evidence of Fournier's gangrene.  Patient was treat empirically with antibiotics with improvement in symptoms.  Patient discharged on cefadroxil  for discharge.

## 2024-02-26 ENCOUNTER — Ambulatory Visit: Payer: Self-pay | Admitting: Infectious Diseases

## 2024-02-28 ENCOUNTER — Ambulatory Visit: Payer: Self-pay | Admitting: Infectious Diseases

## 2024-02-28 ENCOUNTER — Other Ambulatory Visit: Payer: Self-pay

## 2024-02-28 ENCOUNTER — Encounter: Payer: Self-pay | Admitting: Infectious Diseases

## 2024-02-28 VITALS — BP 151/101 | HR 58 | Temp 98.5°F | Wt 232.0 lb

## 2024-02-28 DIAGNOSIS — F17210 Nicotine dependence, cigarettes, uncomplicated: Secondary | ICD-10-CM

## 2024-02-28 DIAGNOSIS — L03115 Cellulitis of right lower limb: Secondary | ICD-10-CM

## 2024-02-28 DIAGNOSIS — I10 Essential (primary) hypertension: Secondary | ICD-10-CM

## 2024-02-28 DIAGNOSIS — I890 Lymphedema, not elsewhere classified: Secondary | ICD-10-CM | POA: Diagnosis not present

## 2024-02-28 DIAGNOSIS — Z21 Asymptomatic human immunodeficiency virus [HIV] infection status: Secondary | ICD-10-CM

## 2024-02-28 DIAGNOSIS — B20 Human immunodeficiency virus [HIV] disease: Secondary | ICD-10-CM

## 2024-02-28 MED ORDER — MEDICAL COMPRESSION STOCKINGS MISC: 1.0000 [IU] | Freq: Once | 0 refills | Status: AC

## 2024-02-28 MED ORDER — CEFADROXIL 500 MG PO CAPS
500.0000 mg | ORAL_CAPSULE | Freq: Two times a day (BID) | ORAL | 0 refills | Status: AC
Start: 2024-02-28 — End: 2024-03-09

## 2024-02-28 MED ORDER — MEDICAL COMPRESSION STOCKINGS MISC
1.0000 [IU] | Freq: Once | 0 refills | Status: DC
Start: 2024-02-28 — End: 2024-02-28

## 2024-02-28 NOTE — Patient Instructions (Addendum)
  [facility]  Address: [address], [place], [place] [address] Phone: [phone]  I think thigh-high compression stockings will be needed to get your leg swelling better faster.  At lease see if they can help you get fitted for longer knee high ones that go all the way up to the knee.   Let's just plan to see you back in 1 month - repeat your blood pressure, make sure the swelling is down and then do you shingles shot and labs then

## 2024-02-28 NOTE — Progress Notes (Signed)
 Subjective:    Patient ID: Gregory Whitney, male    DOB: [birth year], 48 y.o.   MRN: [id number]  HPI:  Gregory Whitney is a 48 y.o. 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  Transmission: sexual   Previous Regimens:  Atripla   Biktarvy      Chief Complaint  Patient presents with   Follow-up    Recheck BP/ declines STD testing      HPI:  Here for routine follow up care. LOV in July 2023 with VL 22 copies (undetectable range) and CD4 ~500 copies. He continues on Biktarvy  everyday without lapse. Has been getting his rx from  [facility].  Continues on atenolol  everyday with pitavastatin .  Has never underwent colon cancer screen.  Male partners, versatile partner. Never had anal cancer screening. 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 Gets so bad it sometimes causes him to wear depends. Was told previously that this would not go away and he has just dealt with this.   OK for flu and covid booster today.   No concern over anxious or depressed mood. No unexpected weight changes. No sexual health or family planning needs.    Review of Systems  Constitutional:  Negative for chills and fever.  HENT:  Negative for sore throat.        No dental problems  Respiratory:  Negative for cough.   Cardiovascular:  Negative for chest pain and leg swelling.  Gastrointestinal:  Negative for abdominal pain, diarrhea and vomiting.  Genitourinary:  Negative for dysuria and flank pain.  Musculoskeletal:  Negative for myalgias and neck pain.  Skin:  Negative for rash.  Neurological:  Negative for dizziness and headaches.  Psychiatric/Behavioral:  The patient is not nervous/anxious.        Objective:     Vitals:   02/28/24 0842 02/28/24 0921  BP: (!) 159/108 (!) 151/101  Pulse: (!) 58   Temp: 98.5 F (36.9 C)   SpO2: 98%     Body mass index is 29.79 kg/m.    Physical  Exam Cardiovascular:     Rate and Rhythm: Normal rate.  Pulmonary:     Effort: Pulmonary effort is normal.     Breath sounds: Normal breath sounds.  Musculoskeletal:        General: Swelling present. No tenderness.     Right lower leg: Edema present.  Skin:    General: Skin is cool.     Findings: Rash present. Rash is scaling.      Neurological:     Mental Status: He is oriented to person, place, and time.        Assessment & Plan:  Assessment and Plan    Lymphedema Right Leg -  Resolved Cellulitis of the leg, right -  Triggered by trauma of prolonged time spent laying on cement floor. Recent hospitalization for cellulitis of the leg with significant swelling. Completed initial course of cefadroxil  with improvement in symptoms. No fever, chills, elevated white blood cell count, or severe infection markers. No deep blood clots detected on imaging. Swollen lymph nodes likely due to infection.  Persistent swelling present on exam today. Skin is flaking and hyperpigmented in some areas without any obvious breaks in skin. Pitting edema through thighs noted.  - Prescribe thigh-high compression stockings to reduce swelling. Wear during waking hours. Can wear at night if no throbbing pain present, skin checks  and removal of stockings 2x daily.  - No need for further antibiotics as cellulitis has resolved.  - Refill cefadroxil  prescription for ON DEMAND use IF symptoms of infection recur. Symptoms discussed today (warmth/fever, chills, malaise, pain, worsened redness of the leg).  - Discuss the importance of moisturizing the skin to prevent cracking. - Consider referral to vascular specialists if swelling does not improve in a month. - Inpatient labs reviewed - normal WBC, normal CK levels. Nothing to follow up on at present.   HIV infection, asymptomatic - HIV infection is well-controlled with Biktarvy . Viral load is undetectable, and CD4 count is within normal range. No issues with  medication adherence. Recent labs were normal. - Will have him back for repeat labs in 15m per patient preference given multiple lab draws during hospital stay recently.  - Offer measles immunity check during next lab visit. - Discuss shingles vaccination due to age and HIV status; provide prescription for shingles vaccine next OV to receive in our pharmacy.   Hypertension - Blood pressure recorded at 151/101 mmHg. Medications adjusted during inpatient stay.  - Continue Atenolol  50 mg daily  - Continue Losartan  25 mg daily  - FU in 75m on reading. With medicaid will try to bridge to PCP for care as well to help manage.   Recording duration: 28 minutes      Meds ordered this encounter  Medications   Elastic Bandages & Supports (MEDICAL COMPRESSION STOCKINGS) MISC    Sig: 1 Units by Does not apply route once for 1 dose. Wear daily during waking hours.    Dispense:  2 each    Refill:  0    Thigh high compression stockings preferred 20-30 mmHg. Patient preference for closed or open toe.   cefadroxil  (DURICEF) 500 MG capsule    Sig: Take 1 capsule (500 mg total) by mouth 2 (two) times daily for 10 days. Start IF you get signs of recurrent cellulitis    Dispense:  20 capsule    Refill:  0   No orders of the defined types were placed in this encounter.   Return in about 4 weeks (around 03/27/2024).   Corean Fireman, MSN, NP-C [facility]  [email or web address] Pager: [phone] Office: [phone] [facility] Main Line: [phone]

## 2024-03-18 ENCOUNTER — Other Ambulatory Visit: Payer: Self-pay | Admitting: Infectious Diseases

## 2024-03-18 DIAGNOSIS — B20 Human immunodeficiency virus [HIV] disease: Secondary | ICD-10-CM

## 2024-03-26 ENCOUNTER — Ambulatory Visit: Admitting: Infectious Diseases

## 2024-08-18 NOTE — Progress Notes (Signed)
 The 10-year ASCVD risk score (Arnett DK, et al., 2019) is: 16.9%   Values used to calculate the score:     Age: 49 years     Clinically relevant sex: Male     Is Non-Hispanic African American: Yes     Diabetic: No     Tobacco smoker: Yes     Systolic Blood Pressure: 151 mmHg     Is BP treated: Yes     HDL Cholesterol: 59 mg/dL     Total Cholesterol: 163 mg/dL  Currently prescribed rosuvastatin  10 mg. PATIENT NOT FILLING.   Gregory Whitney, BSN, RN

## 2024-08-28 ENCOUNTER — Other Ambulatory Visit: Payer: Self-pay | Admitting: Internal Medicine

## 2024-08-28 ENCOUNTER — Other Ambulatory Visit: Payer: Self-pay | Admitting: Infectious Diseases

## 2024-08-28 DIAGNOSIS — B20 Human immunodeficiency virus [HIV] disease: Secondary | ICD-10-CM

## 2024-08-28 DIAGNOSIS — I10 Essential (primary) hypertension: Secondary | ICD-10-CM

## 2024-08-28 NOTE — Telephone Encounter (Signed)
 Gregory Whitney, last office note mentions he should be taking atenolol  and losartan . He states he has not been taking losartan  and has been out of atenolol  for a couple of months. Notified him that atenolol  was dispensed at the end of December with 90 days' worth. He states he received a package of medication but has not opened it yet. Scheduled for follow up with [facility] 2/10. Routing to provider to confirm if patient should restart losartan  with the atenolol .   Myesha Stillion, BSN, RN

## 2024-09-01 ENCOUNTER — Other Ambulatory Visit: Payer: Self-pay | Admitting: Infectious Diseases

## 2024-09-01 DIAGNOSIS — I10 Essential (primary) hypertension: Secondary | ICD-10-CM

## 2024-09-01 MED ORDER — LOSARTAN POTASSIUM 25 MG PO TABS: 25.0000 mg | ORAL_TABLET | Freq: Every day | ORAL | 0 refills | Status: AC

## 2024-09-01 NOTE — Telephone Encounter (Signed)
 Called and spoke with Burnard, relayed per [place] that she would like him to try both atenolol  and losartan  together. Discussed that if he has dizziness or lightheadedness that he can stop atenolol  and continue with losartan  only. He states he has neither of these medications on hand. Refills sent.   Leron Stoffers, BSN, RN

## 2024-09-11 ENCOUNTER — Encounter: Payer: Self-pay | Admitting: Infectious Diseases

## 2024-09-11 MED ORDER — CEFADROXIL 500 MG PO CAPS: 1000.0000 mg | ORAL_CAPSULE | Freq: Two times a day (BID) | ORAL | 0 refills | Status: AC

## 2024-09-15 ENCOUNTER — Ambulatory Visit: Payer: Self-pay | Admitting: Infectious Diseases
